# Patient Record
Sex: Female | Born: 1945 | Race: Black or African American | Hispanic: No | Marital: Single | Smoking: Current some day smoker
Health system: Southern US, Community
[De-identification: ages and names within clinical notes are randomized; demographics above are authoritative.]

## PROBLEM LIST (undated history)

## (undated) DIAGNOSIS — F419 Anxiety disorder, unspecified: Secondary | ICD-10-CM

## (undated) DIAGNOSIS — I10 Essential (primary) hypertension: Secondary | ICD-10-CM

## (undated) DIAGNOSIS — E559 Vitamin D deficiency, unspecified: Secondary | ICD-10-CM

## (undated) DIAGNOSIS — N189 Chronic kidney disease, unspecified: Secondary | ICD-10-CM

## (undated) DIAGNOSIS — E113413 Type 2 diabetes mellitus with severe nonproliferative diabetic retinopathy with macular edema, bilateral: Secondary | ICD-10-CM

## (undated) DIAGNOSIS — H269 Unspecified cataract: Secondary | ICD-10-CM

## (undated) DIAGNOSIS — D649 Anemia, unspecified: Secondary | ICD-10-CM

## (undated) DIAGNOSIS — H409 Unspecified glaucoma: Secondary | ICD-10-CM

## (undated) DIAGNOSIS — E785 Hyperlipidemia, unspecified: Secondary | ICD-10-CM

## (undated) DIAGNOSIS — E119 Type 2 diabetes mellitus without complications: Secondary | ICD-10-CM

## (undated) DIAGNOSIS — T7840XA Allergy, unspecified, initial encounter: Secondary | ICD-10-CM

## (undated) HISTORY — DX: Hyperlipidemia, unspecified: E78.5

## (undated) HISTORY — DX: Type 2 diabetes mellitus without complications: E11.9

## (undated) HISTORY — DX: Anemia, unspecified: D64.9

## (undated) HISTORY — PX: GLAUCOMA REPAIR: SHX214

## (undated) HISTORY — DX: Vitamin D deficiency, unspecified: E55.9

## (undated) HISTORY — DX: Type 2 diabetes mellitus with severe nonproliferative diabetic retinopathy with macular edema, bilateral: E11.3413

## (undated) HISTORY — PX: TOTAL VAGINAL HYSTERECTOMY: SHX2548

## (undated) HISTORY — DX: Anxiety disorder, unspecified: F41.9

## (undated) HISTORY — DX: Chronic kidney disease, unspecified: N18.9

## (undated) HISTORY — DX: Unspecified glaucoma: H40.9

## (undated) HISTORY — PX: TONSILLECTOMY AND ADENOIDECTOMY: SUR1326

## (undated) HISTORY — DX: Allergy, unspecified, initial encounter: T78.40XA

## (undated) HISTORY — DX: Unspecified cataract: H26.9

## (undated) HISTORY — PX: ANKLE FRACTURE SURGERY: SHX122

## (undated) HISTORY — DX: Essential (primary) hypertension: I10

---

## 2018-08-16 ENCOUNTER — Other Ambulatory Visit: Payer: Self-pay | Admitting: Internal Medicine

## 2018-08-16 DIAGNOSIS — Z1231 Encounter for screening mammogram for malignant neoplasm of breast: Secondary | ICD-10-CM

## 2018-08-16 DIAGNOSIS — R5381 Other malaise: Secondary | ICD-10-CM

## 2018-08-25 ENCOUNTER — Other Ambulatory Visit: Payer: Self-pay | Admitting: Internal Medicine

## 2018-08-25 DIAGNOSIS — R922 Inconclusive mammogram: Secondary | ICD-10-CM

## 2018-09-07 ENCOUNTER — Other Ambulatory Visit: Payer: Self-pay

## 2018-09-16 ENCOUNTER — Ambulatory Visit
Admission: RE | Admit: 2018-09-16 | Discharge: 2018-09-16 | Disposition: A | Discharge status: Home / Self Care | Payer: Medicare Other | Source: Ambulatory Visit | Attending: Internal Medicine | Admitting: Internal Medicine

## 2018-09-16 ENCOUNTER — Other Ambulatory Visit: Payer: Self-pay

## 2018-09-16 DIAGNOSIS — R922 Inconclusive mammogram: Secondary | ICD-10-CM

## 2020-03-25 IMAGING — MG DIGITAL DIAGNOSTIC BILATERAL MAMMOGRAM WITH TOMO AND CAD
8 series · 8 of 24 positions shown · non-contrast
Comparison: Previous exam(s).

CLINICAL DATA: Patient for short-term follow-up probably benign
bilateral breast masses on outside imaging.

EXAM:
DIGITAL DIAGNOSTIC BILATERAL MAMMOGRAM WITH CAD AND TOMO
ULTRASOUND BILATERAL BREAST

[R MLO synth-2D]
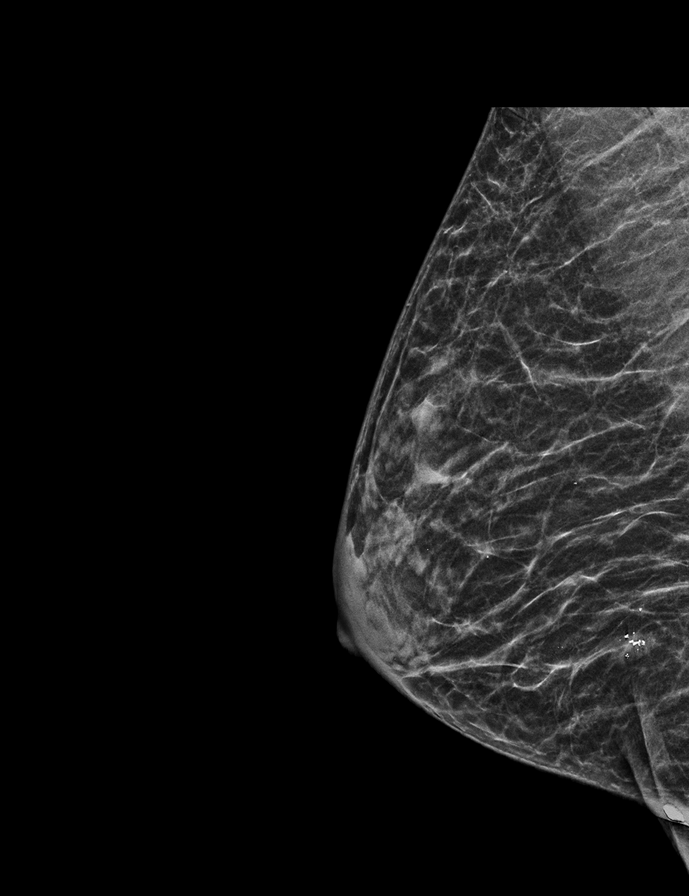

[R CC synth-2D]
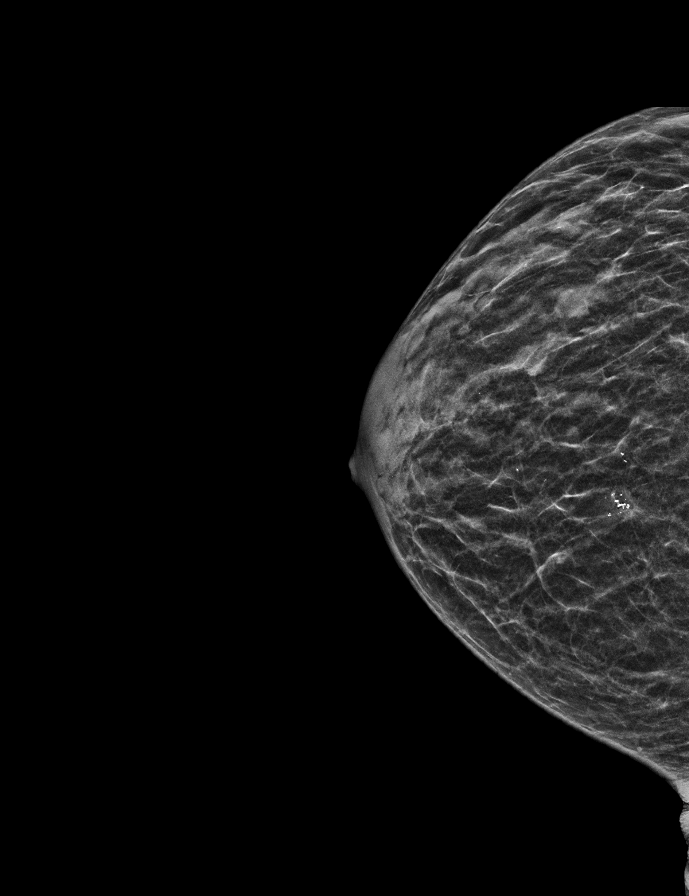

[L CC synth-2D]
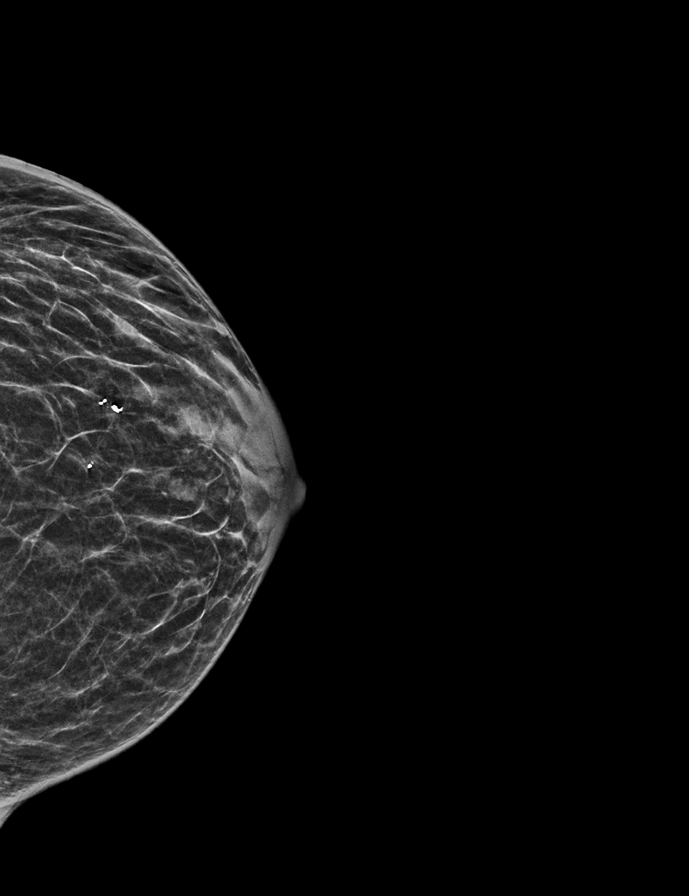

[L MLO synth-2D]
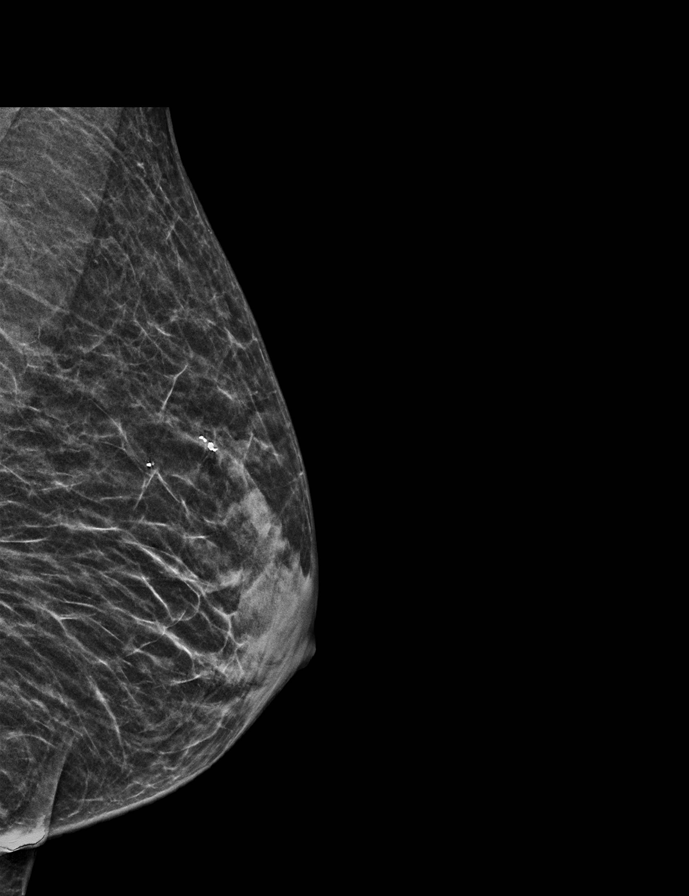

[L MLO tomo · tomo slice 19/36.0]
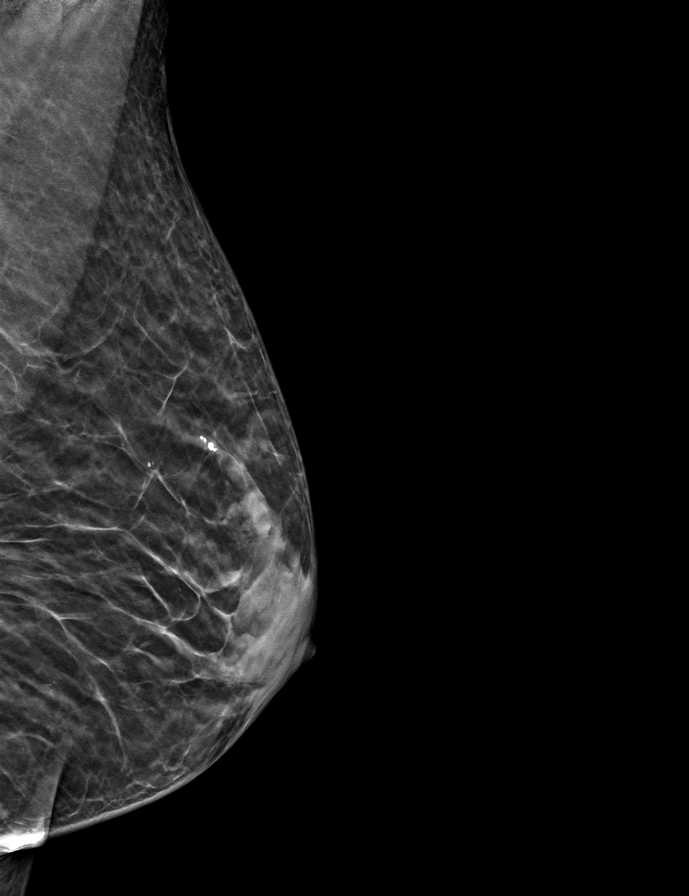

[R CC tomo · tomo slice 17/32.0]
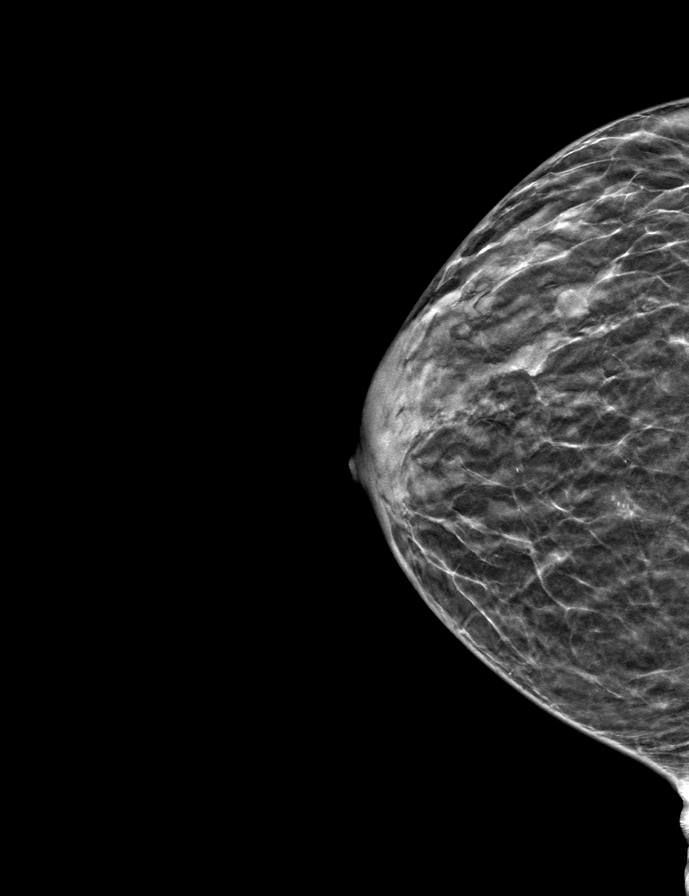

[R MLO tomo · tomo slice 18/35.0]
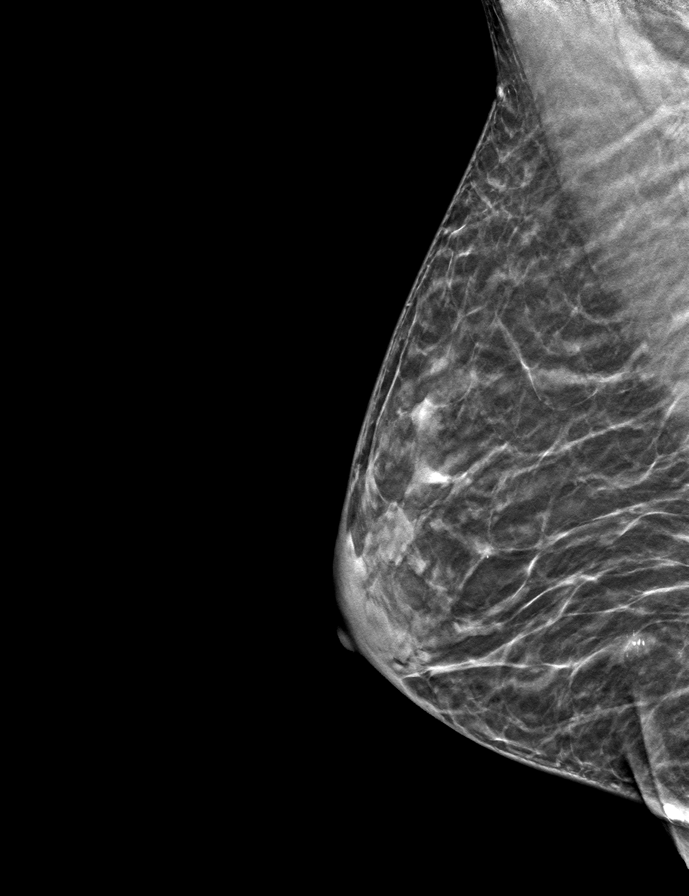

[L CC tomo · tomo slice 17/34.0]
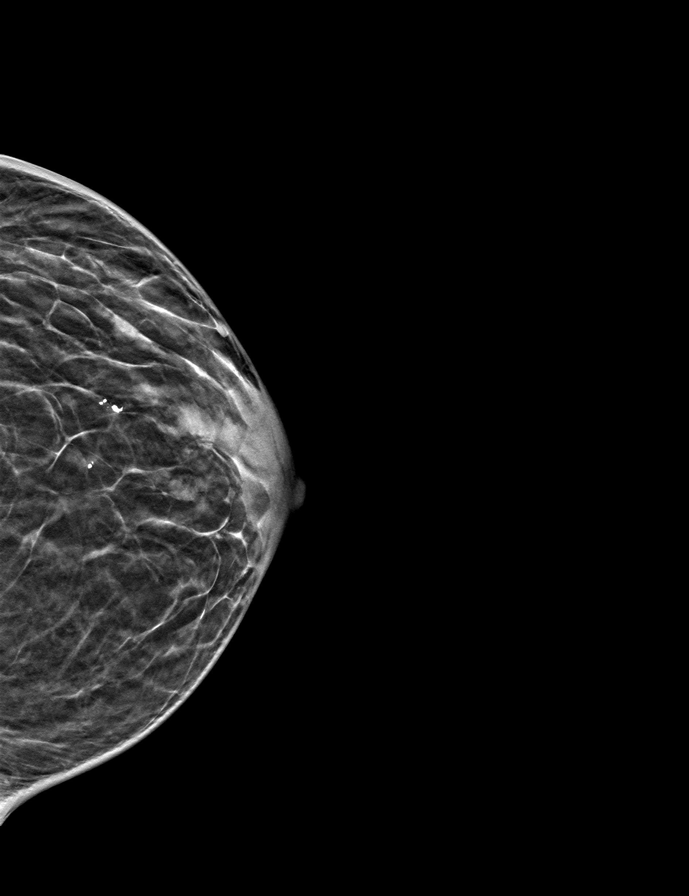

[8 of 24 positions shown; findings below may reference images not displayed]

ACR Breast Density Category c: The breast tissue is heterogeneously
dense, which may obscure small masses.
FINDINGS: Unchanged coarse calcifications with associated mass within the
lower inner right breast posterior depth. Oval circumscribed mass
within the outer right breast is stable. No suspicious findings
within the left breast.

Mammographic images were processed with CAD.

Targeted ultrasound is performed, showing an unchanged lobular
hypoechoic mass left breast 12 o'clock position 3 cm from nipple
measuring 7 x 7 x 3 mm.

Within the right breast 6 o'clock position 1 cm from nipple there is
an unchanged 10 x 4 x 9 mm oval circumscribed hypoechoic mass with
internal coarse calcifications.

Within the right breast 9 o'clock position 4 cm from nipple there is
an unchanged 4 x 2 x 5 mm oval circumscribed hypoechoic mass.

Within the right breast 9 o'clock position 6 cm from nipple there is
an unchanged 10 x 2 x 10 mm oval circumscribed hypoechoic mass.
IMPRESSION: Stable probably benign bilateral breast masses.

RECOMMENDATION:
Bilateral diagnostic mammography and ultrasound in 12 months to
demonstrate 2 years of stability of probably benign bilateral breast
masses.

I have discussed the findings and recommendations with the patient.
Results were also provided in writing at the conclusion of the
visit. If applicable, a reminder letter will be sent to the patient
regarding the next appointment.

BI-RADS CATEGORY  3: Probably benign.

## 2020-03-25 IMAGING — US ULTRASOUND RIGHT BREAST LIMITED
1 series · 13 of 14 positions shown · non-contrast
Comparison: Previous exam(s).

CLINICAL DATA: Patient for short-term follow-up probably benign
bilateral breast masses on outside imaging.

EXAM:
DIGITAL DIAGNOSTIC BILATERAL MAMMOGRAM WITH CAD AND TOMO
ULTRASOUND BILATERAL BREAST

[Series 1: ultrasound right breast limited · 0.06mm/px · 13 of 14 slices shown]
[im 1/14]
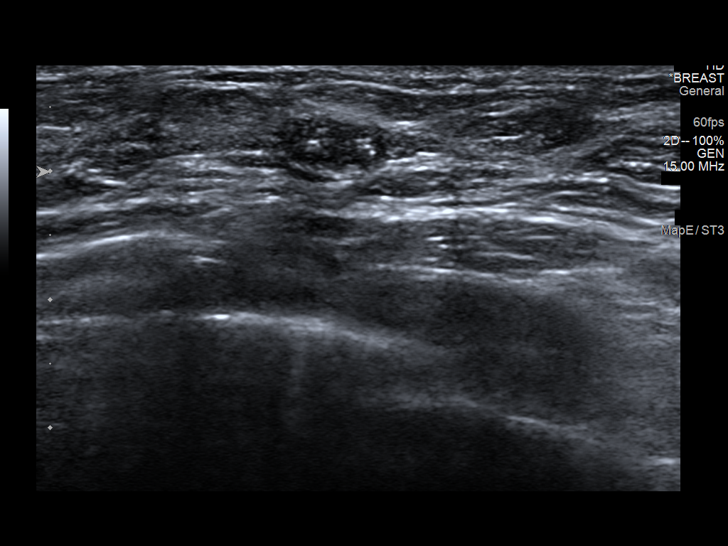
[im 2/14]
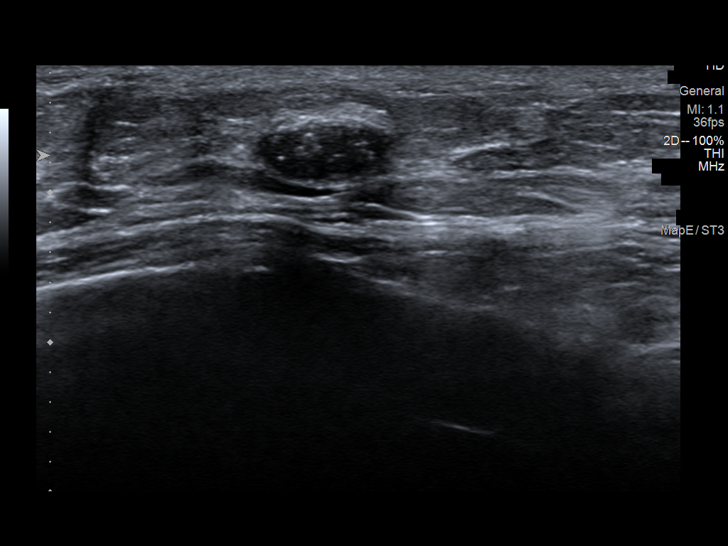
[im 3/14]
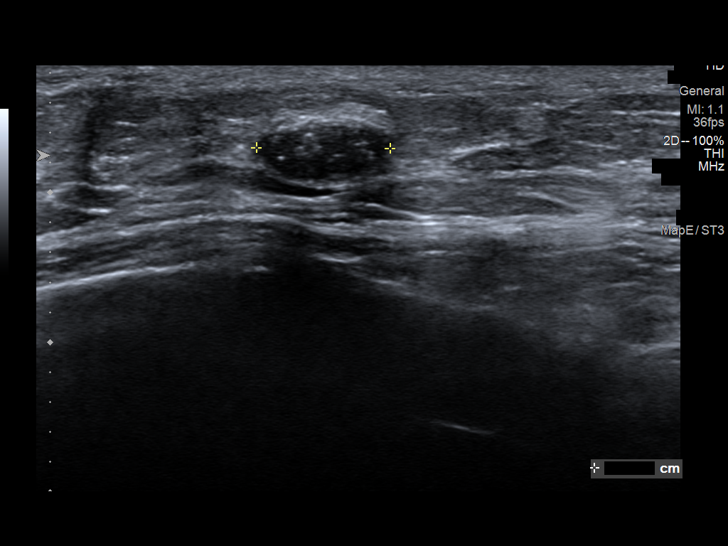
[im 4/14]
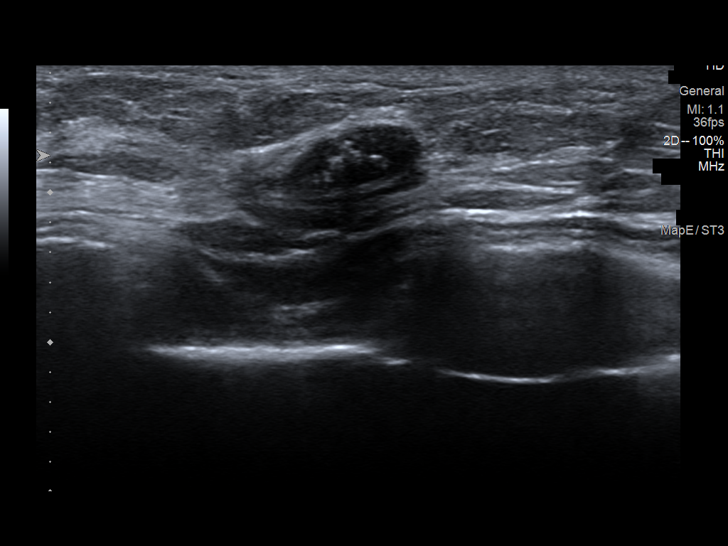
[im 5/14]
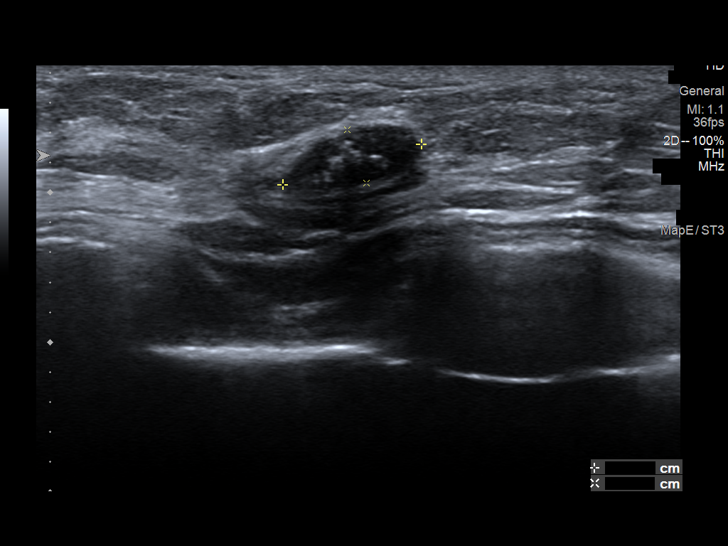
[im 6/14]
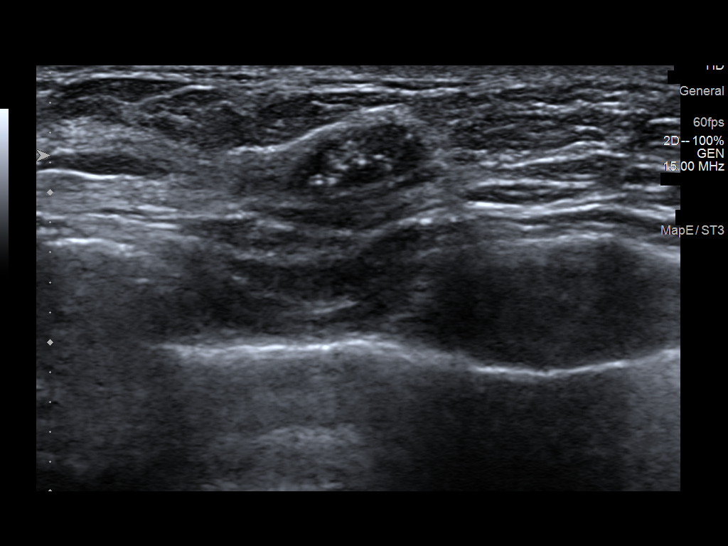
[im 8/14]
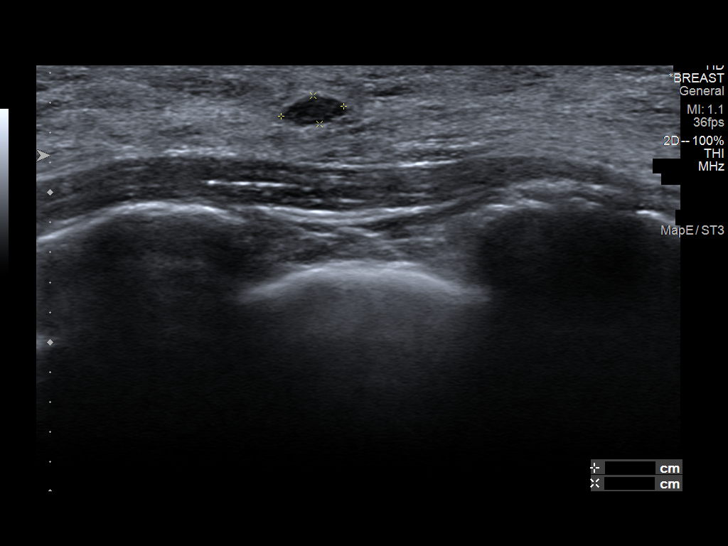
[im 9/14]
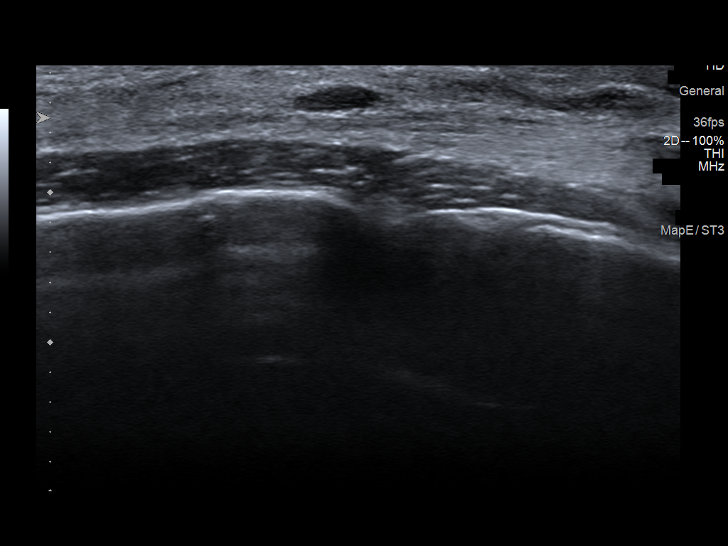
[im 10/14]
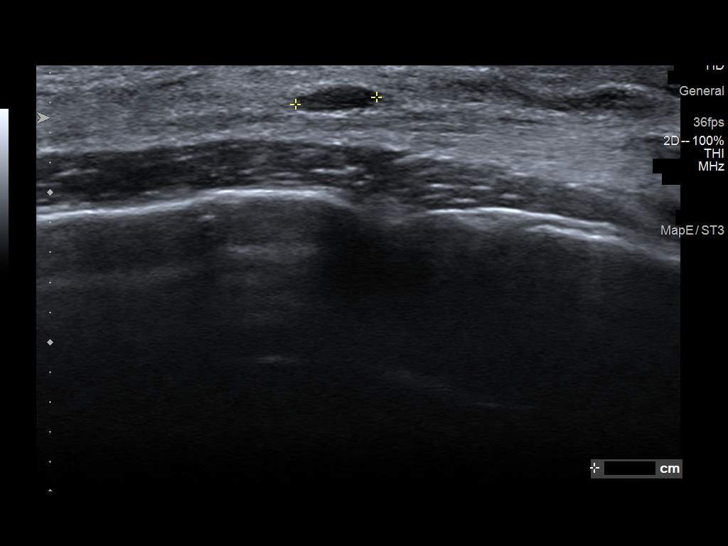
[im 11/14]
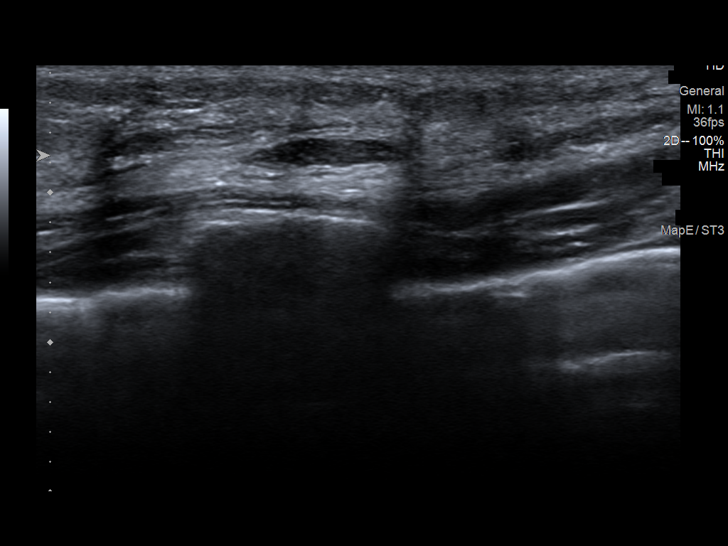
[im 12/14]
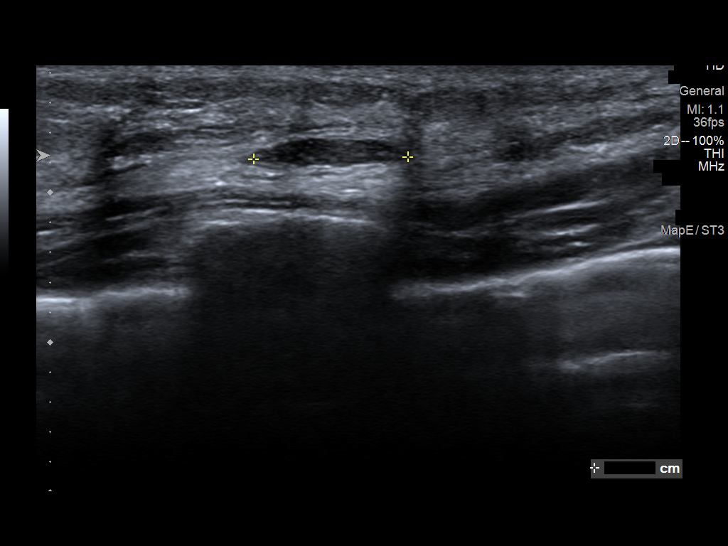
[im 13/14]
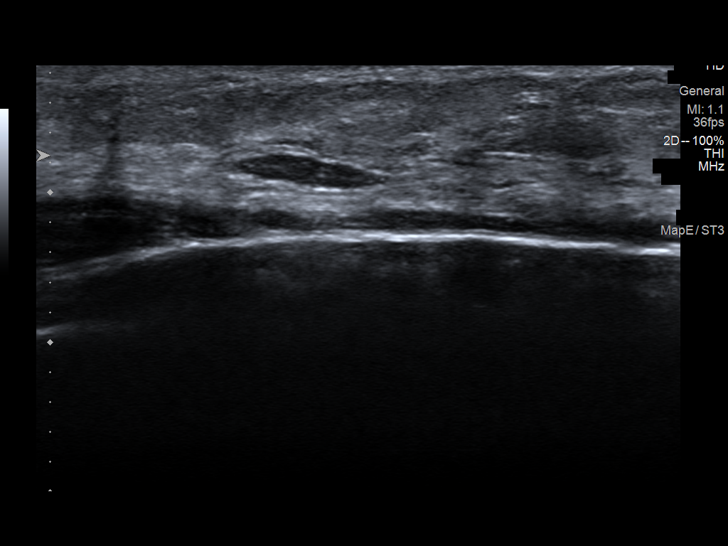
[im 14/14]
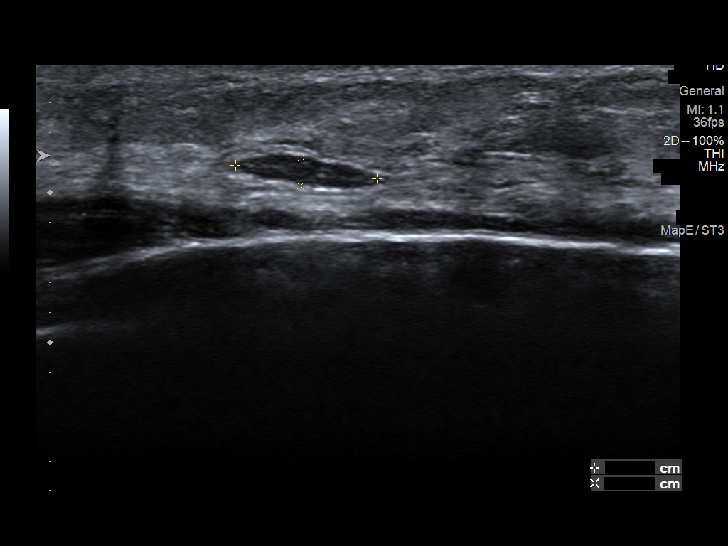

[13 of 14 positions shown; findings below may reference images not displayed]

ACR Breast Density Category c: The breast tissue is heterogeneously
dense, which may obscure small masses.
FINDINGS: Unchanged coarse calcifications with associated mass within the
lower inner right breast posterior depth. Oval circumscribed mass
within the outer right breast is stable. No suspicious findings
within the left breast.

Mammographic images were processed with CAD.

Targeted ultrasound is performed, showing an unchanged lobular
hypoechoic mass left breast 12 o'clock position 3 cm from nipple
measuring 7 x 7 x 3 mm.

Within the right breast 6 o'clock position 1 cm from nipple there is
an unchanged 10 x 4 x 9 mm oval circumscribed hypoechoic mass with
internal coarse calcifications.

Within the right breast 9 o'clock position 4 cm from nipple there is
an unchanged 4 x 2 x 5 mm oval circumscribed hypoechoic mass.

Within the right breast 9 o'clock position 6 cm from nipple there is
an unchanged 10 x 2 x 10 mm oval circumscribed hypoechoic mass.
IMPRESSION: Stable probably benign bilateral breast masses.

RECOMMENDATION:
Bilateral diagnostic mammography and ultrasound in 12 months to
demonstrate 2 years of stability of probably benign bilateral breast
masses.

I have discussed the findings and recommendations with the patient.
Results were also provided in writing at the conclusion of the
visit. If applicable, a reminder letter will be sent to the patient
regarding the next appointment.

BI-RADS CATEGORY  3: Probably benign.

## 2021-08-15 ENCOUNTER — Other Ambulatory Visit (HOSPITAL_COMMUNITY): Payer: Self-pay | Admitting: Internal Medicine

## 2021-08-15 DIAGNOSIS — Z992 Dependence on renal dialysis: Secondary | ICD-10-CM

## 2021-08-22 ENCOUNTER — Ambulatory Visit (HOSPITAL_COMMUNITY)
Admission: RE | Admit: 2021-08-22 | Discharge: 2021-08-22 | Disposition: A | Discharge status: Home / Self Care | Payer: Medicare (Managed Care) | Source: Ambulatory Visit | Attending: Internal Medicine | Admitting: Internal Medicine

## 2021-08-22 DIAGNOSIS — Z992 Dependence on renal dialysis: Secondary | ICD-10-CM | POA: Insufficient documentation

## 2021-08-22 NOTE — Progress Notes (Signed)
Upper extremity vein mapping has been completed.  ? ?Preliminary results in CV Proc.  ? ?Terianna Peggs Nichole Neyer ?08/22/2021 10:13 AM    ?

## 2023-07-08 ENCOUNTER — Encounter: Payer: Self-pay | Admitting: Gastroenterology

## 2023-08-02 ENCOUNTER — Emergency Department (HOSPITAL_COMMUNITY)
Admission: EM | Admit: 2023-08-02 | Discharge: 2023-08-03 | Disposition: A | Discharge status: Home / Self Care | Payer: Medicare (Managed Care) | Attending: Student | Admitting: Student

## 2023-08-02 DIAGNOSIS — N179 Acute kidney failure, unspecified: Secondary | ICD-10-CM | POA: Insufficient documentation

## 2023-08-02 DIAGNOSIS — R350 Frequency of micturition: Secondary | ICD-10-CM | POA: Insufficient documentation

## 2023-08-02 DIAGNOSIS — N189 Chronic kidney disease, unspecified: Secondary | ICD-10-CM | POA: Insufficient documentation

## 2023-08-02 DIAGNOSIS — I129 Hypertensive chronic kidney disease with stage 1 through stage 4 chronic kidney disease, or unspecified chronic kidney disease: Secondary | ICD-10-CM | POA: Diagnosis not present

## 2023-08-02 DIAGNOSIS — R197 Diarrhea, unspecified: Secondary | ICD-10-CM | POA: Diagnosis present

## 2023-08-02 DIAGNOSIS — E162 Hypoglycemia, unspecified: Secondary | ICD-10-CM | POA: Diagnosis not present

## 2023-08-02 DIAGNOSIS — U071 COVID-19: Secondary | ICD-10-CM | POA: Insufficient documentation

## 2023-08-02 LAB — CBG MONITORING, ED: Glucose-Capillary: 136 mg/dL — ABNORMAL HIGH (ref 70–99)

## 2023-08-02 LAB — RESP PANEL BY RT-PCR (RSV, FLU A&B, COVID)  RVPGX2
Influenza A by PCR: NEGATIVE
Influenza B by PCR: NEGATIVE
Resp Syncytial Virus by PCR: NEGATIVE
SARS Coronavirus 2 by RT PCR: POSITIVE — AB

## 2023-08-02 NOTE — ED Triage Notes (Addendum)
 Pt with low CBG this AM 40-60 , family called pt PCP who told her to come to ER, pt CBG for EMS 174 @ 2038.   Pt c/o cough x 1 week, not feeling like eating. Denies any other complaints, no cough per EMS when with pt.   Pt cbg on arrival 136

## 2023-08-03 LAB — COMPREHENSIVE METABOLIC PANEL
ALT: 14 U/L (ref 0–44)
AST: 26 U/L (ref 15–41)
Albumin: 3.4 g/dL — ABNORMAL LOW (ref 3.5–5.0)
Alkaline Phosphatase: 65 U/L (ref 38–126)
Anion gap: 9 (ref 5–15)
BUN: 59 mg/dL — ABNORMAL HIGH (ref 8–23)
CO2: 16 mmol/L — ABNORMAL LOW (ref 22–32)
Calcium: 8.2 mg/dL — ABNORMAL LOW (ref 8.9–10.3)
Chloride: 106 mmol/L (ref 98–111)
Creatinine, Ser: 3.35 mg/dL — ABNORMAL HIGH (ref 0.44–1.00)
GFR, Estimated: 14 mL/min — ABNORMAL LOW (ref >60)
Glucose, Bld: 78 mg/dL (ref 70–99)
Potassium: 4.8 mmol/L (ref 3.5–5.1)
Sodium: 131 mmol/L — ABNORMAL LOW (ref 135–145)
Total Bilirubin: 0.6 mg/dL (ref 0.0–1.2)
Total Protein: 7 g/dL (ref 6.5–8.1)

## 2023-08-03 LAB — URINALYSIS, ROUTINE W REFLEX MICROSCOPIC
Bilirubin Urine: NEGATIVE
Glucose, UA: NEGATIVE mg/dL
Hgb urine dipstick: NEGATIVE
Ketones, ur: NEGATIVE mg/dL
Nitrite: NEGATIVE
Protein, ur: 30 mg/dL — AB
Specific Gravity, Urine: 1.011 (ref 1.005–1.030)
pH: 5 (ref 5.0–8.0)

## 2023-08-03 LAB — CBC WITH DIFFERENTIAL/PLATELET
Abs Immature Granulocytes: 0.03 10*3/uL (ref 0.00–0.07)
Basophils Absolute: 0 10*3/uL (ref 0.0–0.1)
Basophils Relative: 0 %
Eosinophils Absolute: 0 10*3/uL (ref 0.0–0.5)
Eosinophils Relative: 0 %
HCT: 28 % — ABNORMAL LOW (ref 36.0–46.0)
Hemoglobin: 9.2 g/dL — ABNORMAL LOW (ref 12.0–15.0)
Immature Granulocytes: 1 %
Lymphocytes Relative: 16 %
Lymphs Abs: 0.9 10*3/uL (ref 0.7–4.0)
MCH: 31.5 pg (ref 26.0–34.0)
MCHC: 32.9 g/dL (ref 30.0–36.0)
MCV: 95.9 fL (ref 80.0–100.0)
Monocytes Absolute: 0.6 10*3/uL (ref 0.1–1.0)
Monocytes Relative: 11 %
Neutro Abs: 4.1 10*3/uL (ref 1.7–7.7)
Neutrophils Relative %: 72 %
Platelets: 322 10*3/uL (ref 150–400)
RBC: 2.92 MIL/uL — ABNORMAL LOW (ref 3.87–5.11)
RDW: 14.6 % (ref 11.5–15.5)
WBC: 5.7 10*3/uL (ref 4.0–10.5)
nRBC: 0 % (ref 0.0–0.2)

## 2023-08-03 LAB — CBG MONITORING, ED
Glucose-Capillary: 69 mg/dL — ABNORMAL LOW (ref 70–99)
Glucose-Capillary: 221 mg/dL — ABNORMAL HIGH (ref 70–99)

## 2023-08-03 LAB — I-STAT CG4 LACTIC ACID, ED: Lactic Acid, Venous: 0.8 mmol/L (ref 0.5–1.9)

## 2023-08-03 MED ORDER — ACETAMINOPHEN 500 MG PO TABS
1000.0000 mg | ORAL_TABLET | Freq: Once | ORAL | Status: AC
  Administered 2023-08-03: 1000 mg via ORAL
  Filled 2023-08-03: qty 2

## 2023-08-03 MED ORDER — ONDANSETRON 4 MG PO TBDP: 4.0000 mg | ORAL_TABLET | Freq: Three times a day (TID) | ORAL | 0 refills | Status: DC | PRN

## 2023-08-03 MED ORDER — LACTATED RINGERS IV BOLUS
1000.0000 mL | Freq: Once | INTRAVENOUS | Status: AC
  Administered 2023-08-03: 1000 mL via INTRAVENOUS

## 2023-08-03 MED ORDER — FOSFOMYCIN TROMETHAMINE 3 G PO PACK
3.0000 g | PACK | Freq: Once | ORAL | Status: AC
Start: 2023-08-03 — End: 2023-08-03
  Administered 2023-08-03: 3 g via ORAL
  Filled 2023-08-03: qty 3

## 2023-08-03 NOTE — ED Notes (Signed)
 Pt provided with a cup of oj and a mango ice for cbg 69

## 2023-08-03 NOTE — ED Provider Notes (Signed)
 [facility] EMERGENCY DEPARTMENT AT [facility] Provider Note  CSN: [id number] Arrival date & time: 08/02/23 2107  Chief Complaint(s) Hypoglycemia  HPI Karen Mckenzie is a 78 y.o. female with PMH CKD with last creatinine 3.0, anemia, HTN who presents emergency room for evaluation of hypoglycemia, urinary frequency and diarrhea.  States that she has had upper respiratory symptoms for the last 1 week with decreased p.o. intake and multiple episodes of diarrhea.  Patient's blood sugars reportedly between 40 and 60 this morning and spoke with primary care who informed her she needs to come to the emergency department for further evaluation.  Currently, patient is alert and oriented answering questions appropriately.  Currently denies chest pain, shortness of breath, abdominal pain, nausea, vomiting, headache, fever or other systemic symptoms.   Past Medical History No past medical history on file. There are no active problems to display for this patient.  Home Medication(s) Prior to Admission medications   Medication Sig Start Date End Date Taking? Authorizing Provider  ondansetron (ZOFRAN-ODT) 4 MG disintegrating tablet Take 1 tablet (4 mg total) by mouth every 8 (eight) hours as needed for nausea or vomiting. 08/03/23  Yes Evalisse Prajapati, MD                                                                                                                                    Past Surgical History No past surgical history on file. Family History Family History  Problem Relation Age of Onset   Breast cancer Mother 21   Breast cancer Sister 66    Social History   Allergies Ciprofloxacin, Citrus, Penicillins, Codeine, Erythromycin, Ibuprofen, Mushroom, Naproxen, Other, Sulfa antibiotics, Tetracyclines & related, Aspirin-acetaminophen-caffeine, Neomycin, Nsaids, Tuberculin purified protein derivative, Aspirin, Azithromycin, and Strawberry flavoring agent (non-screening)  Review of  Systems Review of Systems  Gastrointestinal:  Positive for diarrhea.  Genitourinary:  Positive for frequency.    Physical Exam Vital Signs  I have reviewed the triage vital signs BP (!) 103/53   Pulse 82   Temp 99.1 F (37.3 C) (Oral)   Resp 18   Ht 4\' 11"  (1.499 m)   Wt 41.3 kg   SpO2 97%   BMI 18.38 kg/m   Physical Exam Vitals and nursing note reviewed.  Constitutional:      General: She is not in acute distress.    Appearance: She is well-developed.  HENT:     Head: Normocephalic and atraumatic.  Eyes:     Conjunctiva/sclera: Conjunctivae normal.  Cardiovascular:     Rate and Rhythm: Normal rate and regular rhythm.     Heart sounds: No murmur heard. Pulmonary:     Effort: Pulmonary effort is normal. No respiratory distress.     Breath sounds: Normal breath sounds.  Abdominal:     Palpations: Abdomen is soft.     Tenderness: There is no abdominal tenderness.  Musculoskeletal:  General: No swelling.     Cervical back: Neck supple.  Skin:    General: Skin is warm and dry.     Capillary Refill: Capillary refill takes less than 2 seconds.  Neurological:     Mental Status: She is alert.  Psychiatric:        Mood and Affect: Mood normal.     ED Results and Treatments Labs (all labs ordered are listed, but only abnormal results are displayed) Labs Reviewed  RESP PANEL BY RT-PCR (RSV, FLU A&B, COVID)  RVPGX2 - Abnormal; Notable for the following components:      Result Value   SARS Coronavirus 2 by RT PCR POSITIVE (*)    All other components within normal limits  COMPREHENSIVE METABOLIC PANEL - Abnormal; Notable for the following components:   Sodium 131 (*)    CO2 16 (*)    BUN 59 (*)    Creatinine, Ser 3.35 (*)    Calcium 8.2 (*)    Albumin 3.4 (*)    GFR, Estimated 14 (*)    All other components within normal limits  CBC WITH DIFFERENTIAL/PLATELET - Abnormal; Notable for the following components:   RBC 2.92 (*)    Hemoglobin 9.2 (*)    HCT  28.0 (*)    All other components within normal limits  URINALYSIS, ROUTINE W REFLEX MICROSCOPIC - Abnormal; Notable for the following components:   Protein, ur 30 (*)    Leukocytes,Ua MODERATE (*)    Bacteria, UA RARE (*)    All other components within normal limits  CBG MONITORING, ED - Abnormal; Notable for the following components:   Glucose-Capillary 136 (*)    All other components within normal limits  CBG MONITORING, ED - Abnormal; Notable for the following components:   Glucose-Capillary 69 (*)    All other components within normal limits  CBG MONITORING, ED - Abnormal; Notable for the following components:   Glucose-Capillary 221 (*)    All other components within normal limits  URINE CULTURE  I-STAT CG4 LACTIC ACID, ED  I-STAT CG4 LACTIC ACID, ED                                                                                                                          Radiology No results found.  Pertinent labs & imaging results that were available during my care of the patient were reviewed by me and considered in my medical decision making (see MDM for details).  Medications Ordered in ED Medications  acetaminophen (TYLENOL) tablet 1,000 mg (1,000 mg Oral Given 08/03/23 0214)  fosfomycin (MONUROL) packet 3 g (3 g Oral Given 08/03/23 0415)  lactated ringers bolus 1,000 mL (1,000 mLs Intravenous Bolus 08/03/23 0414)  Procedures Procedures  (including critical care time)  Medical Decision Making / ED Course   This patient presents to the ED for concern of hypoglycemia, diarrhea, urinary frequency, cough, this involves an extensive number of treatment options, and is a complaint that carries with it a high risk of complications and morbidity.  The differential diagnosis includes COVID-19, influenza, RSV, unspecified viral URI, norovirus, decreased  p.o. intake, insulinoma  MDM: Patient seen emergency room for evaluation of hypoglycemia, diarrhea, cough and urinary frequency.  Physical exam largely unremarkable.  Laboratory evaluation with a hemoglobin of 9.2, creatinine 3.35 with a BUN of 59, CO2 16 with a normal anion gap.  Lactic acid normal.  Urinalysis with moderate leuk esterase, 6-10 white blood cells and rare bacteria.  Patient is COVID-positive which likely is the source of her initial diarrhea and decreased p.o. intake.  She did have an initial drop in her sugar to 69 but after oral repletion a quickly bounced back to 221.  Patient will be covered with fosfomycin and the urine culture sent.  She was fluid resuscitated and able to tolerate p.o. without difficulty.  Her AKI is mild as her creatinine baseline appears to be 3.0.  At this time as she is not able to tolerate p.o. she does not meet inpatient criteria for admission and will be discharged with outpatient follow-up.  Will give Zofran for home use to help encourage p.o. intake at home.  Return precautions given of which she and her daughter voiced understanding.  Patient discharged   Additional history obtained: -Additional history obtained from daughter -External records from outside source obtained and reviewed including: Chart review including previous notes, labs, imaging, consultation notes   Lab Tests: -I ordered, reviewed, and interpreted labs.   The pertinent results include:   Labs Reviewed  RESP PANEL BY RT-PCR (RSV, FLU A&B, COVID)  RVPGX2 - Abnormal; Notable for the following components:      Result Value   SARS Coronavirus 2 by RT PCR POSITIVE (*)    All other components within normal limits  COMPREHENSIVE METABOLIC PANEL - Abnormal; Notable for the following components:   Sodium 131 (*)    CO2 16 (*)    BUN 59 (*)    Creatinine, Ser 3.35 (*)    Calcium 8.2 (*)    Albumin 3.4 (*)    GFR, Estimated 14 (*)    All other components within normal limits  CBC  WITH DIFFERENTIAL/PLATELET - Abnormal; Notable for the following components:   RBC 2.92 (*)    Hemoglobin 9.2 (*)    HCT 28.0 (*)    All other components within normal limits  URINALYSIS, ROUTINE W REFLEX MICROSCOPIC - Abnormal; Notable for the following components:   Protein, ur 30 (*)    Leukocytes,Ua MODERATE (*)    Bacteria, UA RARE (*)    All other components within normal limits  CBG MONITORING, ED - Abnormal; Notable for the following components:   Glucose-Capillary 136 (*)    All other components within normal limits  CBG MONITORING, ED - Abnormal; Notable for the following components:   Glucose-Capillary 69 (*)    All other components within normal limits  CBG MONITORING, ED - Abnormal; Notable for the following components:   Glucose-Capillary 221 (*)    All other components within normal limits  URINE CULTURE  I-STAT CG4 LACTIC ACID, ED  I-STAT CG4 LACTIC ACID, ED       Medicines ordered and prescription drug management: Meds ordered this  encounter  Medications   acetaminophen (TYLENOL) tablet 1,000 mg   fosfomycin (MONUROL) packet 3 g   lactated ringers bolus 1,000 mL   ondansetron (ZOFRAN-ODT) 4 MG disintegrating tablet    Sig: Take 1 tablet (4 mg total) by mouth every 8 (eight) hours as needed for nausea or vomiting.    Dispense:  20 tablet    Refill:  0    -I have reviewed the patients home medicines and have made adjustments as needed  Critical interventions none    Social Determinants of Health:  Factors impacting patients care include: none   Reevaluation: After the interventions noted above, I reevaluated the patient and found that they have :improved  Co morbidities that complicate the patient evaluation No past medical history on file.    Dispostion: I considered admission for this patient, but at this time she does not meet inpatient criteria for admission and will be discharged with outpatient follow-up.     Final Clinical  Impression(s) / ED Diagnoses Final diagnoses:  Hypoglycemia  COVID  AKI (acute kidney injury) [facility])     @PCDICTATION @    Glendora Score, MD 08/03/23 [phone]

## 2023-08-04 LAB — URINE CULTURE

## 2023-08-20 ENCOUNTER — Ambulatory Visit: Payer: Medicare (Managed Care) | Admitting: Gastroenterology

## 2023-10-04 ENCOUNTER — Telehealth: Payer: Self-pay | Admitting: Podiatry

## 2023-10-04 NOTE — Telephone Encounter (Signed)
 Due to not having access to your previous appointments, Patient advised she had a 10:00 appointment today 10/04/23 and will not make it to appointment. She requested to cancel appointment

## 2023-10-05 ENCOUNTER — Other Ambulatory Visit: Payer: Self-pay

## 2023-10-08 ENCOUNTER — Other Ambulatory Visit (INDEPENDENT_AMBULATORY_CARE_PROVIDER_SITE_OTHER): Payer: Medicare (Managed Care)

## 2023-10-08 ENCOUNTER — Encounter: Payer: Self-pay | Admitting: Gastroenterology

## 2023-10-08 ENCOUNTER — Ambulatory Visit: Payer: Medicare (Managed Care) | Admitting: Gastroenterology

## 2023-10-08 VITALS — BP 124/60 | HR 72 | Ht <= 58 in | Wt 89.0 lb

## 2023-10-08 DIAGNOSIS — E1122 Type 2 diabetes mellitus with diabetic chronic kidney disease: Secondary | ICD-10-CM

## 2023-10-08 DIAGNOSIS — R634 Abnormal weight loss: Secondary | ICD-10-CM

## 2023-10-08 DIAGNOSIS — D509 Iron deficiency anemia, unspecified: Secondary | ICD-10-CM | POA: Diagnosis not present

## 2023-10-08 DIAGNOSIS — Z1211 Encounter for screening for malignant neoplasm of colon: Secondary | ICD-10-CM

## 2023-10-08 LAB — COMPREHENSIVE METABOLIC PANEL WITH GFR
ALT: 7 U/L (ref 0–35)
AST: 12 U/L (ref 0–37)
Albumin: 4.2 g/dL (ref 3.5–5.2)
Alkaline Phosphatase: 89 U/L (ref 39–117)
BUN: 41 mg/dL — ABNORMAL HIGH (ref 6–23)
CO2: 20 meq/L (ref 19–32)
Calcium: 9.4 mg/dL (ref 8.4–10.5)
Chloride: 109 meq/L (ref 96–112)
Creatinine, Ser: 2.77 mg/dL — ABNORMAL HIGH (ref 0.40–1.20)
GFR: 16.23 mL/min — ABNORMAL LOW (ref >60.00)
Glucose, Bld: 136 mg/dL — ABNORMAL HIGH (ref 70–99)
Potassium: 5 meq/L (ref 3.5–5.1)
Sodium: 138 meq/L (ref 135–145)
Total Bilirubin: 0.4 mg/dL (ref 0.2–1.2)
Total Protein: 7.3 g/dL (ref 6.0–8.3)

## 2023-10-08 LAB — IBC + FERRITIN
Ferritin: 16 ng/mL (ref 10.0–291.0)
Iron: 55 ug/dL (ref 42–145)
Saturation Ratios: 14.5 % — ABNORMAL LOW (ref 20.0–50.0)
TIBC: 379.4 ug/dL (ref 250.0–450.0)
Transferrin: 271 mg/dL (ref 212.0–360.0)

## 2023-10-08 LAB — CBC WITH DIFFERENTIAL/PLATELET
Basophils Absolute: 0 10*3/uL (ref 0.0–0.1)
Basophils Relative: 0.6 % (ref 0.0–3.0)
Eosinophils Absolute: 0.2 10*3/uL (ref 0.0–0.7)
Eosinophils Relative: 2.2 % (ref 0.0–5.0)
HCT: 28.2 % — ABNORMAL LOW (ref 36.0–46.0)
Hemoglobin: 9.2 g/dL — ABNORMAL LOW (ref 12.0–15.0)
Lymphocytes Relative: 25.1 % (ref 12.0–46.0)
Lymphs Abs: 1.8 10*3/uL (ref 0.7–4.0)
MCHC: 32.7 g/dL (ref 30.0–36.0)
MCV: 100 fl (ref 78.0–100.0)
Monocytes Absolute: 0.5 10*3/uL (ref 0.1–1.0)
Monocytes Relative: 7 % (ref 3.0–12.0)
Neutro Abs: 4.8 10*3/uL (ref 1.4–7.7)
Neutrophils Relative %: 65.1 % (ref 43.0–77.0)
Platelets: 414 10*3/uL — ABNORMAL HIGH (ref 150.0–400.0)
RBC: 2.82 Mil/uL — ABNORMAL LOW (ref 3.87–5.11)
RDW: 14.6 % (ref 11.5–15.5)
WBC: 7.3 10*3/uL (ref 4.0–10.5)

## 2023-10-08 LAB — VITAMIN B12: Vitamin B-12: 1305 pg/mL — ABNORMAL HIGH (ref 211–911)

## 2023-10-08 LAB — FOLATE: Folate: 23.3 ng/mL (ref >5.9)

## 2023-10-08 MED ORDER — NA SULFATE-K SULFATE-MG SULF 17.5-3.13-1.6 GM/177ML PO SOLN: ORAL | 0 refills | Status: DC

## 2023-10-08 NOTE — Progress Notes (Signed)
 Chief Complaint: Evaluation of anemia Primary GI MD: Para Bold  HPI: 78 year old female history of diabetes, hypertension, hyperlipidemia, anemia, presents for evaluation of iron deficiency anemia  Referred by PCP for iron deficiency anemia  Appears patient had mild anemia noted October 2020 with Hgb 11.7 and this ranged from 10-11 up until July 2024 when she was noted to have Hgb 7.5.  12/11/2022: Hgb 7.5, MCV 99.8 02/03/2023: Hgb 9.1, MCV 94.5. Iron 47, saturation 14%, TIBC 341 06/22/2023: Hgb 10.8, MCV 96, platelets 414.  Iron 45, saturation 12%, TIBC 362 08/03/2023: Hgb 9.2, MCV 95.9 08/23/2023: Hgb 7.6, MCV 97.4 09/14/2023: Hgb 9.5, MCV 97.8   Discussed the use of AI scribe software for clinical note transcription with the patient, who gave verbal consent to proceed.  History of Present Illness she has a history of anemia since childhood, with a noted worsening since 2020. Her hemoglobin levels have decreased from 10-11 in 2020 to the 7s currently. She is currently taking iron supplements once daily and has not required blood transfusions or iron infusions. No gastrointestinal bleeding, rectal bleeding, or black stools.  She underwent a colonoscopy approximately ten years ago at the [place] at the [place] in [place]  but has never had an endoscopy.  Reportedly normal with no polyps.  No changes in bowel habits, nausea, or vomiting. She has experienced significant weight loss from 187 pounds to 89 pounds over the past 15 years, initially intentional due to a prediabetes diagnosis in 2005, which led her to reduce her portion sizes.  Her past medical history includes a diagnosis of borderline kidney disease, though her kidneys are still functioning well without any urinary symptoms. No family history of colon cancer, though a cousin had colon polyps.  She takes B12 supplements in pill form and recalls receiving B12 shots weekly as a child. No history of heart attack, stroke, or  seizures.   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 Take one tablet by mouth every 7 days     amLODipine (NORVASC) 10 MG tablet Take 10 mg by mouth daily.     ascorbic acid (VITAMIN C) 250 MG tablet Take 1 tablet by mouth daily.     ASPIRIN 81 PO Take 1 tablet by mouth daily.     atorvastatin (LIPITOR) 80 MG tablet Take 80 mg by mouth daily.     brimonidine (ALPHAGAN) 0.2 % ophthalmic solution Place 1 drop into the left eye 3 (three) times daily.     Brinzolamide-Brimonidine 1-0.2 % SUSP Place 0.05 mLs into both eyes 2 (two) times daily.     Calcium Carbonate Antacid (CALCIUM CARBONATE PO) Take 1,200 mg by mouth daily.     cyanocobalamin (VITAMIN B12) 250 MCG tablet Take 500 mcg by mouth  daily.     dorzolamide-timolol (COSOPT) 2-0.5 % ophthalmic solution Place 1 drop into both eyes 2 (two) times daily.     ferrous sulfate 325 (65 FE) MG tablet Take 325 mg by mouth 2 (two) times daily.     FREESTYLE TEST STRIPS test strip 3 (three) times daily.     glipiZIDE (GLUCOTROL) 5 MG tablet Take 1 tablet by mouth 2 (two) times daily.     insulin glargine (LANTUS SOLOSTAR) 100 UNIT/ML Solostar Pen Inject 10-15 Units into the skin.     JANUVIA 25 MG tablet Take by mouth.     latanoprost (XALATAN) 0.005 % ophthalmic solution Place 1 drop into the left eye at  bedtime.     lisinopril (ZESTRIL) 10 MG tablet Take 1 tablet by mouth every morning.     Netarsudil-Latanoprost (ROCKLATAN) 0.02-0.005 % SOLN Apply 1 drop to eye daily.     Vitamin D, Ergocalciferol, (DRISDOL) 1.25 MG (50000 UNIT) CAPS capsule Take 1 capsule by mouth once a week.     VITAMIN E PO Take 1 capsule by mouth daily.     No current facility-administered medications for this visit.    Allergies as of 10/08/2023 - never reviewed  Allergen Reaction Noted   Ciprofloxacin Anaphylaxis and Hives 09/17/2015   Citrus Itching 07/11/2020   Penicillins Anaphylaxis, Hives, and Other (See Comments) 09/17/2015   Codeine Other (See Comments) 03/17/2012   Erythromycin Hives and Rash 03/17/2012   Ibuprofen Nausea And Vomiting and Other (See Comments) 07/30/2016   Mushroom  05/26/2018   Naproxen Other (See Comments) 07/30/2016   Other Hives, Diarrhea, Nausea And Vomiting, Swelling, and Rash 03/17/2012   Sulfa antibiotics Swelling, Diarrhea, Hives, and Rash 03/17/2012   Tetracyclines & related Rash and Swelling 07/30/2016   Aspirin-acetaminophen -caffeine Swelling 01/12/2014   Neomycin Hives 10/30/2015   Nsaids Diarrhea 01/12/2014   Tuberculin purified protein derivative  07/30/2016   Aspirin Diarrhea 05/30/2018   Azithromycin Hives and Rash 03/17/2012   Strawberry flavoring agent (non-screening) Rash 07/16/2022    Family History  Problem Relation Age of Onset   Breast cancer Mother 12   Hypertension Father    Lung cancer Father    Breast cancer Sister 44   Diabetes Maternal Grandmother    Heart attack Maternal Aunt     Social History   Socioeconomic History   Marital status: Single    Spouse name: Not on file   Number of children: 0   Years of education: Not on file   Highest education level: Not on file  Occupational History   Occupation: retired  Tobacco Use   Smoking status: Every Day    Types: Cigarettes   Smokeless tobacco: Never  Vaping Use   Vaping status: Never  Used  Substance and Sexual Activity   Alcohol use: Never   Drug use: Never   Sexual activity: Not on file  Other Topics Concern   Not on file  Social History Narrative   Not on file   Social Drivers of Health   Financial Resource Strain: Low Risk  (06/22/2023)   Received from [facility]   Overall Financial Resource Strain (CARDIA)    Difficulty of Paying Living Expenses: Not hard at all  Food Insecurity: No Food Insecurity (06/22/2023)   Received from [facility]   Hunger Vital Sign    Worried About Running Out of Food in the Last Year: Never true    Ran Out of Food in the Last Year: Never true  Transportation  Needs: No Transportation Needs (06/22/2023)   Received from [facility]   PRAPARE - Transportation    Lack of Transportation (Medical): No    Lack of Transportation (Non-Medical): No  Physical Activity: Inactive (06/22/2023)   Received from [facility]   Exercise Vital Sign    Days of Exercise per Week: 0 days    Minutes of Exercise per Session: 20 min  Stress: No Stress Concern Present (06/22/2023)   Received from [facility] - Occupational Stress Questionnaire    Feeling of Stress : Not at all  Social Connections: Socially Integrated (06/22/2023)   Received from [facility]   Social Network    How would you rate your social network (family, work, friends)?: Good participation with social networks  Intimate Partner Violence: Not At Risk (06/22/2023)   Received from [facility]   HITS    Over the last 12 months how often did your partner physically hurt you?: Never    Over the last 12 months how often did your partner insult you or talk down to you?: Never    Over the last 12 months how often did your partner threaten you with physical harm?: Never    Over the last 12 months how often did your partner scream or curse at you?: Never    Review of Systems:    Constitutional: No weight loss, fever, chills, weakness  or fatigue HEENT: Eyes: No change in vision               Ears, Nose, Throat:  No change in hearing or congestion Skin: No rash or itching Cardiovascular: No chest pain, chest pressure or palpitations   Respiratory: No SOB or cough Gastrointestinal: See HPI and otherwise negative Genitourinary: No dysuria or change in urinary frequency Neurological: No headache, dizziness or syncope Musculoskeletal: No new muscle or joint pain Hematologic: No bleeding or bruising Psychiatric: No history of depression or anxiety    Physical Exam:  Vital signs: BP 124/60 (BP Location: Left Arm, Patient Position: Sitting, Cuff Size: Normal)   Pulse 72   Ht 4' 8.5" (1.435 m) Comment: height measured without shoes  Wt 89 lb (40.4 kg)   BMI 19.60 kg/m   Constitutional: NAD, alert and cooperative.  Thin and frail female appears younger than stated age Head:  Normocephalic and atraumatic. Eyes:   PEERL, EOMI. No icterus. Conjunctiva pink. Respiratory: Respirations even and unlabored. Lungs clear to auscultation bilaterally.   No wheezes, crackles, or rhonchi.  Cardiovascular:  Regular rate and rhythm. No peripheral edema, cyanosis or pallor.  Gastrointestinal:  Soft, nondistended, nontender. No rebound or guarding. Normal bowel sounds. No appreciable masses or hepatomegaly. Rectal:  Declines Msk:  Symmetrical without gross deformities. Without edema, no deformity or joint abnormality.  Neurologic:  Alert and  oriented x4;  grossly normal neurologically.  Skin:   Dry and intact without significant lesions or rashes. Psychiatric: Oriented to person, place and time. Demonstrates good judgement and reason without abnormal affect or behaviors.   RELEVANT LABS AND IMAGING: CBC    Component Value Date/Time   WBC 5.7 08/03/2023 0234   RBC 2.92 (L) 08/03/2023 0234   HGB 9.2 (L) 08/03/2023 0234   HCT 28.0 (L) 08/03/2023 0234   PLT 322 08/03/2023 0234   MCV 95.9 08/03/2023 0234   MCH 31.5 08/03/2023 0234    MCHC 32.9 08/03/2023 0234   RDW 14.6 08/03/2023 0234   LYMPHSABS 0.9 08/03/2023 0234   MONOABS  0.6 08/03/2023 0234   EOSABS 0.0 08/03/2023 0234   BASOSABS 0.0 08/03/2023 0234    CMP     Component Value Date/Time   NA 131 (L) 08/03/2023 0234   K 4.8 08/03/2023 0234   CL 106 08/03/2023 0234   CO2 16 (L) 08/03/2023 0234   GLUCOSE 78 08/03/2023 0234   BUN 59 (H) 08/03/2023 0234   CREATININE 3.35 (H) 08/03/2023 0234   CALCIUM 8.2 (L) 08/03/2023 0234   PROT 7.0 08/03/2023 0234   ALBUMIN 3.4 (L) 08/03/2023 0234   AST 26 08/03/2023 0234   ALT 14 08/03/2023 0234   ALKPHOS 65 08/03/2023 0234   BILITOT 0.6 08/03/2023 0234   GFRNONAA 14 (L) 08/03/2023 0234     Assessment/Plan:   Anemia, likely multifactorial Normocytic anemia reportedly since she was 7 with documentation since at least 2020 and worsening drop in hemoglobin since July 2024 ranging 7-9.  Mild iron deficiency with iron 47, saturation 14%, TIBC 341.  No ferritin.  Colonoscopy over 10 years ago no previous EGD.   History of CKD likely contributing to patient's anemia and suspect vitamin B12 deficiency since she reports B12 shots ongoing since childhood and now taking daily tablet.  On iron once daily with no previous blood transfusions or iron infusions.  No GI symptoms and no overt bleeding. - CBC, CMP, iron/ferritin, vitamin B12/folate - EGD/colonoscopy for further evaluation - I thoroughly discussed the procedure with the patient (at bedside) to include nature of the procedure, alternatives, benefits, and risks (including but not limited to bleeding, infection, perforation, anesthesia/cardiac pulmonary complications).  Patient verbalized understanding and gave verbal consent to proceed with procedure. - Continue iron supplement once daily  CKD BUN 59, creatinine 3.35, GFR 14.  Likely contributing to her anemia.  Diabetes  Weight loss Weight loss going from 187 pounds to 89 pounds over the past 15 to 20 years after  changing her portion size.  Her weight has remained stable though on physical exam she does appear thin but younger than stated age. - Continue to monitor  Assigned to Dr. Cherryl Corona based on procedure schedule availability  [facility] 10/08/2023, 10:19 AM  Cc: Alfredia Ina, MD

## 2023-10-08 NOTE — Patient Instructions (Addendum)
 You have been scheduled for an endoscopy and colonoscopy. Please follow the written instructions given to you at your visit today.  If you use inhalers (even only as needed), please bring them with you on the day of your procedure.  DO NOT TAKE 7 DAYS PRIOR TO TEST- Trulicity (dulaglutide) Ozempic, Wegovy (semaglutide) Mounjaro (tirzepatide) Bydureon Bcise (exanatide extended release)  DO NOT TAKE 1 DAY PRIOR TO YOUR TEST Rybelsus (semaglutide) Adlyxin (lixisenatide) Victoza (liraglutide) Byetta (exanatide) ___________________________________________________________________________  We have sent the following medications to your pharmacy for you to pick up at your convenience: Suprep  If your blood pressure at your visit was 140/90 or greater, please contact your primary care physician to follow up on this.  _______________________________________________________  If you are age 76 or older, your body mass index should be between 23-30. Your Body mass index is 19.6 kg/m. If this is out of the aforementioned range listed, please consider follow up with your Primary Care Provider.  If you are age 49 or younger, your body mass index should be between 19-25. Your Body mass index is 19.6 kg/m. If this is out of the aformentioned range listed, please consider follow up with your Primary Care Provider.   ________________________________________________________  The [facility] providers would like to encourage you to use MYCHART to communicate with providers for non-urgent requests or questions.  Due to long hold times on the telephone, sending your provider a message by [facility] may be a faster and more efficient way to get a response.  Please allow 48 business hours for a response.  Please remember that this is for non-urgent requests.  _______________________________________________________ Due to recent changes in healthcare laws, you may see the results of your imaging and laboratory  studies on MyChart before your provider has had a chance to review them.  We understand that in some cases there may be results that are confusing or concerning to you. Not all laboratory results come back in the same time frame and the provider may be waiting for multiple results in order to interpret others.  Please give us  48 hours in order for your provider to thoroughly review all the results before contacting the office for clarification of your results.   Thank you for entrusting me with your care and choosing [facility].  Bayley Luan Rumpf, PA-C

## 2023-10-12 ENCOUNTER — Ambulatory Visit: Payer: Self-pay | Admitting: *Deleted

## 2023-10-28 NOTE — Progress Notes (Signed)
 Agree with the assessment and plan as outlined by Boone Master, PA-C.

## 2023-11-10 ENCOUNTER — Telehealth: Payer: Self-pay | Admitting: Gastroenterology

## 2023-11-10 NOTE — Telephone Encounter (Signed)
 Patient called and stated that she is needing us  to fax over her colonoscopy report to the [place] whenever her procedure is done so that her PCP can review it. Patient provided a fax number to the [place] [phone] Attn : Dr. Lonni Robert. Please advise.

## 2023-11-19 ENCOUNTER — Telehealth: Payer: Self-pay | Admitting: Gastroenterology

## 2023-11-19 NOTE — Telephone Encounter (Signed)
 PT needs update prep instructions mailed to her since she changed the appointment time.

## 2023-11-22 NOTE — Telephone Encounter (Signed)
 Spoke to patient advised updated instructions was mailed  all questions was addressed

## 2023-11-23 ENCOUNTER — Encounter: Payer: Medicare (Managed Care) | Admitting: Gastroenterology

## 2024-01-24 ENCOUNTER — Ambulatory Visit: Payer: Medicare (Managed Care) | Admitting: Gastroenterology

## 2024-01-24 ENCOUNTER — Encounter: Payer: Self-pay | Admitting: Gastroenterology

## 2024-01-24 VITALS — BP 148/73 | HR 77 | Temp 97.9°F | Resp 21 | Ht <= 58 in | Wt 89.0 lb

## 2024-01-24 DIAGNOSIS — K2971 Gastritis, unspecified, with bleeding: Secondary | ICD-10-CM

## 2024-01-24 DIAGNOSIS — K31A12 Gastric intestinal metaplasia without dysplasia, involving the body (corpus): Secondary | ICD-10-CM

## 2024-01-24 DIAGNOSIS — B9681 Helicobacter pylori [H. pylori] as the cause of diseases classified elsewhere: Secondary | ICD-10-CM

## 2024-01-24 DIAGNOSIS — D509 Iron deficiency anemia, unspecified: Secondary | ICD-10-CM | POA: Diagnosis not present

## 2024-01-24 DIAGNOSIS — Z1211 Encounter for screening for malignant neoplasm of colon: Secondary | ICD-10-CM

## 2024-01-24 DIAGNOSIS — K5730 Diverticulosis of large intestine without perforation or abscess without bleeding: Secondary | ICD-10-CM | POA: Diagnosis not present

## 2024-01-24 DIAGNOSIS — K2970 Gastritis, unspecified, without bleeding: Secondary | ICD-10-CM

## 2024-01-24 MED ORDER — SODIUM CHLORIDE 0.9 % IV SOLN: 500.0000 mL | INTRAVENOUS | Status: DC

## 2024-01-24 MED ORDER — FLEET ENEMA RE ENEM
1.0000 | ENEMA | Freq: Once | RECTAL | Status: AC
  Administered 2024-01-24: 1 via RECTAL

## 2024-01-24 NOTE — Progress Notes (Signed)
 Report to PACU, RN, vss, BBS= Clear.

## 2024-01-24 NOTE — Op Note (Signed)
 [facility] Patient Name: Karen Mckenzie Procedure Date: 01/24/2024 10:53 AM MRN: [id number] Endoscopist: Glendia E. Stacia , MD, [id number] Age: 78 Referring MD:  Date of Birth: 21-Nov-1945 Gender: Female Account #: [id number] Procedure:                Colonoscopy Indications:              Iron deficiency anemia Medicines:                Monitored Anesthesia Care Procedure:                Pre-Anesthesia Assessment:                           - Prior to the procedure, a History and Physical                            was performed, and patient medications and                            allergies were reviewed. The patient's tolerance of                            previous anesthesia was also reviewed. The risks                            and benefits of the procedure and the sedation                            options and risks were discussed with the patient.                            All questions were answered, and informed consent                            was obtained. Prior Anticoagulants: The patient has                            taken no anticoagulant or antiplatelet agents                            except for aspirin. ASA Grade Assessment: III - A                            patient with severe systemic disease. After                            reviewing the risks and benefits, the patient was                            deemed in satisfactory condition to undergo the                            procedure.  After obtaining informed consent, the colonoscope                            was passed under direct vision. Throughout the                            procedure, the patient's blood pressure, pulse, and                            oxygen saturations were monitored continuously. The                            CF [id number] #[id number] was introduced through the anus                            and advanced to the the cecum, identified by                             appendiceal orifice and ileocecal valve. The                            colonoscopy was performed without difficulty. The                            colonoscopy was somewhat difficult due to poor                            bowel prep and significant looping. Successful                            completion of the procedure was aided by using                            manual pressure. The patient tolerated the                            procedure well. The quality of the bowel                            preparation was poor. The ileocecal valve,                            appendiceal orifice, and rectum were photographed.                            The bowel preparation used was SUPREP via split                            dose instruction. Scope In: 11:41:41 AM Scope Out: 12:08:38 PM Scope Withdrawal Time: 0 hours 16 minutes 44 seconds  Total Procedure Duration: 0 hours 26 minutes 57 seconds  Findings:                 The perianal and digital rectal examinations were  normal. Pertinent negatives include normal                            sphincter tone and no palpable rectal lesions.                           Many large-mouthed and small-mouthed diverticula                            were found in the sigmoid colon and descending                            colon. There was no evidence of diverticular                            bleeding.                           The exam was otherwise normal throughout the                            examined colon.                           The retroflexed view of the distal rectum and anal                            verge was normal and showed no anal or rectal                            abnormalities. Complications:            No immediate complications. Estimated Blood Loss:     Estimated blood loss: none. Impression:               - Preparation of the colon was poor. Preparation                             was adequate enough to exclude mass lesion as a                            cause of iron deficiency anemia. Smaller polyps or                            vascular lesions such as angioectasias could have                            easily been missed.                           - Severe diverticulosis in the sigmoid colon and in                            the descending colon. There was no evidence of  diverticular bleeding.                           - The distal rectum and anal verge are normal on                            retroflexion view.                           - No specimens collected. Recommendation:           - Patient has a contact number available for                            emergencies. The signs and symptoms of potential                            delayed complications were discussed with the                            patient. Return to normal activities tomorrow.                            Written discharge instructions were provided to the                            patient.                           - Resume previous diet.                           - Given poor prep, consider repeat colonoscopy in 1                            year based on patient's health at that time.                           - Would recommend 2 day prep with subsequent                            colonoscopy                           - Recommend high fiber diet or daily fiber                            supplement to reduce risk of diverticular                            complications. Elayjah Chaney E. Stacia, MD 01/24/2024 12:32:11 PM This report has been signed electronically.

## 2024-01-24 NOTE — Patient Instructions (Signed)
 Discharge instructions given. Handouts on Gastritis and Diverticulosis. Resume previous medications. See report for recommendations. YOU HAD AN ENDOSCOPIC PROCEDURE TODAY AT THE [facility]:   Refer to the procedure report that was given to you for any specific questions about what was found during the examination.  If the procedure report does not answer your questions, please call your gastroenterologist to clarify.  If you requested that your care partner not be given the details of your procedure findings, then the procedure report has been included in a sealed envelope for you to review at your convenience later.  YOU SHOULD EXPECT: Some feelings of bloating in the abdomen. Passage of more gas than usual.  Walking can help get rid of the air that was put into your GI tract during the procedure and reduce the bloating. If you had a lower endoscopy (such as a colonoscopy or flexible sigmoidoscopy) you may notice spotting of blood in your stool or on the toilet paper. If you underwent a bowel prep for your procedure, you may not have a normal bowel movement for a few days.  Please Note:  You might notice some irritation and congestion in your nose or some drainage.  This is from the oxygen used during your procedure.  There is no need for concern and it should clear up in a day or so.  SYMPTOMS TO REPORT IMMEDIATELY:  Following lower endoscopy (colonoscopy or flexible sigmoidoscopy):  Excessive amounts of blood in the stool  Significant tenderness or worsening of abdominal pains  Swelling of the abdomen that is new, acute  Fever of 100F or higher  Following upper endoscopy (EGD)  Vomiting of blood or coffee ground material  New chest pain or pain under the shoulder blades  Painful or persistently difficult swallowing  New shortness of breath  Fever of 100F or higher  Black, tarry-looking stools  For urgent or emergent issues, a gastroenterologist can be reached at any hour  by calling [phone]. Do not use MyChart messaging for urgent concerns.    DIET:  We do recommend a small meal at first, but then you may proceed to your regular diet.  Drink plenty of fluids but you should avoid alcoholic beverages for 24 hours.  ACTIVITY:  You should plan to take it easy for the rest of today and you should NOT DRIVE or use heavy machinery until tomorrow (because of the sedation medicines used during the test).    FOLLOW UP: Our staff will call the number listed on your records the next business day following your procedure.  We will call around 7:15- 8:00 am to check on you and address any questions or concerns that you may have regarding the information given to you following your procedure. If we do not reach you, we will leave a message.     If any biopsies were taken you will be contacted by phone or by letter within the next 1-3 weeks.  Please call us  at [phone] if you have not heard about the biopsies in 3 weeks.    SIGNATURES/CONFIDENTIALITY: You and/or your care partner have signed paperwork which will be entered into your electronic medical record.  These signatures attest to the fact that that the information above on your After Visit Summary has been reviewed and is understood.  Full responsibility of the confidentiality of this discharge information lies with you and/or your care-partner.

## 2024-01-24 NOTE — Progress Notes (Signed)
 Called to room to assist during endoscopic procedure.  Patient ID and intended procedure confirmed with present staff. Received instructions for my participation in the procedure from the performing physician.

## 2024-01-24 NOTE — Progress Notes (Signed)
 [facility] History and Physical   Primary Care Physician:  Pcp, No   Reason for Procedure:   Iron deficiency anemia  Plan:    EGD, colonoscopy     HPI: Karen Mckenzie is a 78 y.o. female undergoing EGD and colonoscopy to evaluate iron deficiency anemia.  Her baseline hgb was 10-11 in 2020, but was noted to be 7.5 in July 2024 and has remained between 7.5-10 since then.   Iron panel with mild iron deficiency (ferritin 16, sat 14%)  She has no family history of colon cancer and no chronic GI symptoms. No overt GI blood loss. She tells me she has been anemic her whole life. She reports having a colonoscopy over 10 years ago.  Most recent hgb 8.3 in December 20, 2023, MCV 95.  Most recent Scr 2.77 in May 2025.   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 Authorizing Provider  ascorbic acid (VITAMIN C) 250 MG tablet Take 1 tablet by mouth daily. 08/29/20  Yes [provider]  ASPIRIN 81 PO Take 1 tablet by mouth daily. 07/16/22  Yes [provider]  atorvastatin (LIPITOR) 80 MG tablet Take 80 mg by mouth daily. 02/01/15  Yes [provider]  brimonidine (ALPHAGAN) 0.2 % ophthalmic solution Place 1 drop into the left eye 3 (three) times daily. 09/02/20  Yes [provider]  Brinzolamide-Brimonidine 1-0.2 % SUSP Place 0.05 mLs into  both eyes 2 (two) times daily. 04/01/20  Yes [provider]  Calcium Carbonate Antacid (CALCIUM CARBONATE PO) Take 1,200 mg by mouth daily. 08/29/20  Yes [provider]  cyanocobalamin  (VITAMIN B12) 250 MCG tablet Take 500 mcg by mouth daily. 01/27/21  Yes [provider]  dorzolamide-timolol (COSOPT) 2-0.5 % ophthalmic solution Place 1 drop into both eyes 2 (two) times daily.   Yes [provider]  ferrous sulfate 325 (65 FE) MG tablet Take 325 mg by mouth 2 (two) times daily. 05/27/20  Yes [provider]  FREESTYLE TEST STRIPS test strip 3 (three) times daily. 06/25/23  Yes [provider]  glipiZIDE (GLUCOTROL) 5 MG tablet Take 1 tablet by mouth 2 (two) times daily. 01/12/14  Yes [provider]  insulin glargine (LANTUS SOLOSTAR) 100 UNIT/ML Solostar Pen Inject 10-15 Units into the skin. 09/14/23  Yes [provider]  JANUVIA 25 MG tablet Take by mouth. 11/10/21  Yes [provider]  latanoprost (XALATAN) 0.005 % ophthalmic solution Place 1 drop into the left eye at bedtime. 10/21/20  Yes [provider]  Vitamin D, Ergocalciferol, (DRISDOL) 1.25 MG (50000 UNIT) CAPS capsule Take 1 capsule by mouth once a week. 08/14/22  Yes [provider]  VITAMIN E PO Take 1 capsule by  mouth daily.   Yes [provider]  alendronate (FOSAMAX) 70 MG tablet Take 70 mg by mouth every 7 (seven) days. Take one tablet by mouth every 7 days 02/01/15   [provider]  amLODipine (NORVASC) 10 MG tablet Take 10 mg by mouth daily. 05/10/19   [provider]  lisinopril (ZESTRIL) 10 MG tablet Take 1 tablet by mouth every morning. Patient not taking: Reported on 01/24/2024 01/12/14   [provider]  Netarsudil-Latanoprost (ROCKLATAN) 0.02-0.005 % SOLN Apply 1 drop to eye daily. 01/09/23   [provider]    Current Outpatient Medications  Medication Sig Dispense Refill   ascorbic acid  (VITAMIN C) 250 MG tablet Take 1 tablet by mouth daily.     ASPIRIN 81 PO Take 1 tablet by mouth daily.     atorvastatin (LIPITOR) 80 MG tablet Take 80 mg by mouth daily.     brimonidine (ALPHAGAN) 0.2 % ophthalmic solution Place 1 drop into the left eye 3 (three) times daily.     Brinzolamide-Brimonidine 1-0.2 % SUSP Place 0.05 mLs into both eyes 2 (two) times daily.     Calcium Carbonate Antacid (CALCIUM CARBONATE PO) Take 1,200 mg by mouth daily.     cyanocobalamin  (VITAMIN B12) 250 MCG tablet Take 500 mcg by mouth daily.     dorzolamide-timolol (COSOPT) 2-0.5 % ophthalmic solution Place 1 drop into both eyes 2 (two) times daily.     ferrous sulfate 325 (65 FE) MG tablet Take 325 mg by mouth 2 (two) times daily.     FREESTYLE TEST STRIPS test strip 3 (three) times daily.     glipiZIDE (GLUCOTROL) 5 MG tablet Take 1 tablet by mouth 2 (two) times daily.     insulin glargine (LANTUS SOLOSTAR) 100 UNIT/ML Solostar Pen Inject 10-15 Units into the skin.     JANUVIA 25 MG tablet Take by mouth.     latanoprost (XALATAN) 0.005 % ophthalmic solution Place 1 drop into the left eye at bedtime.     Vitamin D, Ergocalciferol, (DRISDOL) 1.25 MG (50000 UNIT) CAPS capsule Take 1 capsule by mouth once a week.     VITAMIN E PO Take 1 capsule by mouth daily.     alendronate (FOSAMAX) 70 MG tablet Take 70 mg by mouth every 7 (seven) days. Take one tablet by mouth every 7 days     amLODipine (NORVASC) 10 MG tablet Take 10 mg by mouth daily.     lisinopril (ZESTRIL) 10 MG tablet Take 1 tablet by mouth every morning. (Patient not taking: Reported on 01/24/2024)     Netarsudil-Latanoprost (ROCKLATAN) 0.02-0.005 % SOLN Apply 1 drop to eye daily.     Current Facility-Administered Medications  Medication Dose Route Frequency Provider Last Rate Last Admin   0.9 %  sodium chloride  infusion  500 mL Intravenous Continuous Stacia Glendia BRAVO, MD        Allergies as of 01/24/2024 - Review Complete 01/24/2024  Allergen  Reaction Noted   Ciprofloxacin Anaphylaxis and Hives 09/17/2015   Citrus Itching 07/11/2020   Penicillins Anaphylaxis, Hives, and Other (See Comments) 09/17/2015   Aspirin-acetaminophen -caffeine Swelling 01/12/2014   Codeine Other (See Comments) 03/17/2012   Erythromycin Hives and Rash 03/17/2012   Ibuprofen Nausea And Vomiting and Other (See Comments) 07/30/2016   Lisinopril Other (See Comments) 12/21/2023   Mushroom  05/26/2018   Naproxen Other (See Comments) 07/30/2016   Other Hives, Diarrhea, Nausea And Vomiting, Swelling, and Rash 03/17/2012   Sodium bicarbonate Other (See Comments)  11/09/2023   Sulfa antibiotics Swelling, Diarrhea, Hives, and Rash 03/17/2012   Tetracyclines & related Rash and Swelling 07/30/2016   Aspirin Diarrhea 05/30/2018   Azithromycin Hives and Rash 03/17/2012   Neomycin Hives 10/30/2015   Nsaids Diarrhea 01/12/2014   Strawberry flavoring agent (non-screening) Rash 07/16/2022   Tuberculin purified protein derivative Other (See Comments) 07/30/2016    Family History  Problem Relation Age of Onset   Breast cancer Mother 68   Hypertension Father    Lung cancer Father    Breast cancer Sister 71   Heart attack Maternal Aunt    Diabetes Maternal Grandmother    Colon cancer Neg Hx    Esophageal cancer Neg Hx    Rectal cancer Neg Hx    Stomach cancer Neg Hx     Social History   Socioeconomic History   Marital status: Single    Spouse name: Not on file   Number of children: 0   Years of education: Not on file   Highest education level: Not on file  Occupational History   Occupation: retired  Tobacco Use   Smoking status: Every Day    Types: Cigarettes   Smokeless tobacco: Never  Vaping Use   Vaping status: Never Used  Substance and Sexual Activity   Alcohol use: Never   Drug use: Never   Sexual activity: Not on file  Other Topics Concern   Not on file  Social History Narrative   Not on file   Social Drivers of Health   Financial  Resource Strain: Low Risk  (06/22/2023)   Received from [facility]   Overall Financial Resource Strain (CARDIA)    Difficulty of Paying Living Expenses: Not hard at all  Food Insecurity: No Food Insecurity (06/22/2023)   Received from [facility]   Hunger Vital Sign    Within the past 12 months, you worried that your food would run out before you got the money to buy more.: Never true    Within the past 12 months, the food you bought just didn't last and you didn't have money to get more.: Never true  Transportation Needs: No Transportation Needs (06/22/2023)   Received from [facility] - Transportation    Lack of Transportation (Medical): No    Lack of Transportation (Non-Medical): No  Physical Activity: Inactive (06/22/2023)   Received from [facility]   Exercise Vital Sign    On average, how many days per week do you engage in moderate to strenuous exercise (like a brisk walk)?: 0 days    On average, how many minutes do you engage in exercise at this level?: 20 min  Stress: No Stress Concern Present (06/22/2023)   Received from [facility] - Occupational Stress Questionnaire    Feeling of Stress : Not at all  Social Connections: Socially Integrated (06/22/2023)   Received from [facility]   Social Network    How would you rate your social network (family, work, friends)?: Good participation with social networks  Intimate Partner Violence: Not At Risk (06/22/2023)   Received from [facility]   HITS    Over the last 12 months how often did your partner physically hurt you?: Never    Over the last 12 months how often did your partner insult you or talk down to you?: Never    Over the last 12 months how often did your partner threaten you with physical harm?: Never  Over the last 12 months how often did your partner scream or curse at you?: Never    Review of Systems:  All other review of systems negative except as  mentioned in the HPI.  Physical Exam: Vital signs BP (!) 150/87   Pulse 69   Temp 97.9 F (36.6 C) (Temporal)   Ht 4' 8.5 (1.435 m)   Wt 89 lb (40.4 kg)   SpO2 100%   BMI 19.60 kg/m   General:   Alert,  Well-developed, well-nourished, pleasant and cooperative in NAD Airway:  Mallampati 3 Lungs:  Clear throughout to auscultation.   Heart:  Regular rate and rhythm; no murmurs, clicks, rubs,  or gallops. Abdomen:  Soft, nontender and nondistended. Normal bowel sounds.   Neuro/Psych:  Normal mood and affect. A and O x 3   Cayci Mcnabb E. Stacia, MD [facility]

## 2024-01-24 NOTE — Op Note (Addendum)
 [facility] Patient Name: Karen Mckenzie Procedure Date: 01/24/2024 10:54 AM MRN: [id number] Endoscopist: Glendia E. Stacia , MD, [id number] Age: 78 Referring MD:  Date of Birth: 03/11/1946 Gender: Female Account #: [id number] Procedure:                Upper GI endoscopy Indications:              Iron deficiency anemia Medicines:                Monitored Anesthesia Care Procedure:                Pre-Anesthesia Assessment:                           - Prior to the procedure, a History and Physical                            was performed, and patient medications and                            allergies were reviewed. The patient's tolerance of                            previous anesthesia was also reviewed. The risks                            and benefits of the procedure and the sedation                            options and risks were discussed with the patient.                            All questions were answered, and informed consent                            was obtained. Prior Anticoagulants: The patient has                            taken no anticoagulant or antiplatelet agents. ASA                            Grade Assessment: III - A patient with severe                            systemic disease. After reviewing the risks and                            benefits, the patient was deemed in satisfactory                            condition to undergo the procedure.                           After obtaining informed consent, the endoscope was  passed under direct vision. Throughout the                            procedure, the patient's blood pressure, pulse, and                            oxygen saturations were monitored continuously. The                            Olympus Scope SN [id number] was introduced through the                            mouth, and advanced to the second part of duodenum.                            The upper GI  endoscopy was accomplished without                            difficulty. The patient tolerated the procedure                            well. Scope In: Scope Out: Findings:                 The examined esophagus was normal.                           Diffuse mild inflammation characterized by erythema                            and friability was found in the gastric body. There                            was scant old blood and pinpoint hemorrhage                            throughout the gastric body. Biopsies were taken                            with a cold forceps for Helicobacter pylori                            testing. Estimated blood loss was minimal.                           The examined duodenum was normal. Biopsies for                            histology were taken with a cold forceps for                            evaluation of celiac disease. A small amount of                            blood  was seen in the duodenal bulb, but no                            bleeding source present. Complications:            No immediate complications. Estimated Blood Loss:     Estimated blood loss was minimal. Impression:               - Normal esophagus.                           - Gastritis characterized by erythema, friability                            with multifocal pinpoint hemorrhagewith a small                            amount of old blood in the stomach . Biopsied.                           - Normal examined duodenum. Biopsied.                           - A small amount of blood was seen in the duodenal                            bulb, but no bleeding source present. This likely                            originated from the stomach.                           - Although her anemia is likely multifactorial,                            with renal insufficiency playing a prominent role,                            the gastritis noted may be contributing to her mild                             iron deficiency. Recommendation:           - Await pathology results.                           - Patient has a contact number available for                            emergencies. The signs and symptoms of potential                            delayed complications were discussed with the                            patient. Return to normal activities tomorrow.  Written discharge instructions were provided to the                            patient.                           - Resume previous diet.                           - Await pathology results.                           - Consider stopping aspirin if no clear benefit.                           - Avoiding PPI given renal insufficiency and no                            absolute indication. Lofton Leon E. Stacia, MD 01/24/2024 12:21:06 PM This report has been signed electronically.

## 2024-01-25 ENCOUNTER — Telehealth: Payer: Self-pay | Admitting: *Deleted

## 2024-01-25 NOTE — Telephone Encounter (Signed)
  Follow up Call-     01/24/2024   10:20 AM  Call back number  Post procedure Call Back phone  # [phone]  Permission to leave phone message Yes     Patient questions:  Do you have a fever, pain , or abdominal swelling? No. Pain Score  0 *  Have you tolerated food without any problems? Yes.    Have you been able to return to your normal activities? Yes.    Do you have any questions about your discharge instructions: Diet   No. Medications  No. Follow up visit  No.  Do you have questions or concerns about your Care? No.  Actions: * If pain score is 4 or above: No action needed, pain <4.

## 2024-01-27 LAB — SURGICAL PATHOLOGY

## 2024-01-31 ENCOUNTER — Ambulatory Visit: Payer: Self-pay | Admitting: Gastroenterology

## 2024-01-31 NOTE — Progress Notes (Signed)
 Karen Mckenzie,  The biopsies from your recent upper GI Endoscopy were notable for H. Pylori gastritis.  H. pylori is a common bacteria which can cause chronic symptoms of abdominal pain, nausea and bloating.  It can also cause iron deficiency anemia as well as increase the risk of stomach ulcers and stomach cancer.  We need to eradicate the bacteria.  Sometimes the bacteria is very difficult to eradicate, so it is  important to take the medications as directed. Please take the medications below:  1) Omeprazole 20 mg 2 times a day x 14 d 2) Pepto Bismol 2 tabs (262 mg each) 4 times a day x 14 d 3) Metronidazole 250 mg 4 times a day x 14 d 4) doxycycline 100 mg 2 times a day x 14 d  4 weeks after treatment completed, check H. Pylori stool antigen to confirm eradication (must be off acid suppression therapy for 2 weeks prior to specimen submission)  Dx: H. Pylori gastritis   Team,  Please call Karen Mckenzie, relay the above message, and place the orders for the medications and H. Pylori stool test (either antigen or PCR)

## 2024-02-02 NOTE — Telephone Encounter (Signed)
 PT is calling to get an update on the new medication he was to send in for her. Please advise.

## 2024-02-03 ENCOUNTER — Other Ambulatory Visit: Payer: Self-pay

## 2024-02-03 DIAGNOSIS — A048 Other specified bacterial intestinal infections: Secondary | ICD-10-CM

## 2024-02-15 ENCOUNTER — Telehealth: Payer: Self-pay | Admitting: Gastroenterology

## 2024-02-15 NOTE — Telephone Encounter (Signed)
 Dr. Stacia I do not see where the pt was told to hold his BP med. Please advise if you are aware of this, see note below.

## 2024-02-15 NOTE — Telephone Encounter (Signed)
 Inbound call from this patient PCP her name is Dr. Vinie she is associated with the [facility]. Dr. Vinie is wanting to know why this patient was told to stop taking there BP medication. Dr. Madie is requesting a call back. Best number to reach Dr. Vinie is [phone]. please advise.

## 2024-02-16 NOTE — Telephone Encounter (Signed)
 Attempted to reach Dr. Vinie at the number provided below. Still no answer and unable to leave a message. Will await further communication from [facility],

## 2024-02-16 NOTE — Telephone Encounter (Signed)
 Attempted to return call, no answer and unable to leave message, will try again later.

## 2024-02-21 ENCOUNTER — Encounter: Payer: Self-pay | Admitting: Infectious Diseases

## 2024-02-21 ENCOUNTER — Ambulatory Visit (INDEPENDENT_AMBULATORY_CARE_PROVIDER_SITE_OTHER): Payer: Medicare (Managed Care) | Admitting: Infectious Diseases

## 2024-02-21 ENCOUNTER — Other Ambulatory Visit: Payer: Self-pay

## 2024-02-21 VITALS — BP 165/83 | HR 49 | Temp 97.6°F | Ht 58.5 in | Wt 92.0 lb

## 2024-02-21 DIAGNOSIS — B9681 Helicobacter pylori [H. pylori] as the cause of diseases classified elsewhere: Secondary | ICD-10-CM

## 2024-02-21 DIAGNOSIS — Z880 Allergy status to penicillin: Secondary | ICD-10-CM | POA: Insufficient documentation

## 2024-02-21 DIAGNOSIS — F17210 Nicotine dependence, cigarettes, uncomplicated: Secondary | ICD-10-CM | POA: Insufficient documentation

## 2024-02-21 DIAGNOSIS — K2970 Gastritis, unspecified, without bleeding: Secondary | ICD-10-CM | POA: Insufficient documentation

## 2024-02-21 DIAGNOSIS — Z889 Allergy status to unspecified drugs, medicaments and biological substances status: Secondary | ICD-10-CM | POA: Insufficient documentation

## 2024-02-21 NOTE — Progress Notes (Unsigned)
 There are no active problems to display for this patient.   Patient's Medications  New Prescriptions   No medications on file  Previous Medications   ALENDRONATE (FOSAMAX) 70 MG TABLET    Take 70 mg by mouth every 7 (seven) days. Take one tablet by mouth every 7 days   AMLODIPINE (NORVASC) 10 MG TABLET    Take 10 mg by mouth daily.   ASCORBIC ACID (VITAMIN C) 250 MG TABLET    Take 1 tablet by mouth daily.   ASPIRIN 81 PO    Take 1 tablet by mouth daily.   ATORVASTATIN (LIPITOR) 80 MG TABLET    Take 80 mg by mouth daily.   BRIMONIDINE (ALPHAGAN) 0.2 % OPHTHALMIC SOLUTION    Place 1 drop into the left eye 3 (three) times daily.   BRINZOLAMIDE-BRIMONIDINE 1-0.2 % SUSP    Place 0.05 mLs into both eyes 2 (two) times daily.   CALCIUM CARBONATE ANTACID (CALCIUM CARBONATE PO)    Take 1,200 mg by mouth daily.   CYANOCOBALAMIN  (VITAMIN B12) 250 MCG TABLET    Take 500 mcg by mouth daily.   DORZOLAMIDE-TIMOLOL (COSOPT) 2-0.5 % OPHTHALMIC SOLUTION    Place 1 drop into both eyes 2 (two) times daily.   FERROUS SULFATE 325 (65 FE) MG TABLET    Take 325 mg by mouth 2 (two) times daily.   FREESTYLE TEST STRIPS TEST STRIP    3 (three) times daily.   GLIPIZIDE (GLUCOTROL) 5 MG TABLET    Take 1 tablet by mouth 2 (two) times daily.   INSULIN GLARGINE (LANTUS SOLOSTAR) 100 UNIT/ML SOLOSTAR PEN    Inject 10-15 Units into the skin.   JANUVIA 25 MG TABLET    Take by mouth.   LATANOPROST (XALATAN) 0.005 % OPHTHALMIC SOLUTION    Place 1 drop into the left eye at bedtime.   LISINOPRIL (ZESTRIL) 10 MG TABLET    Take 1 tablet by mouth every morning.   NETARSUDIL-LATANOPROST (ROCKLATAN) 0.02-0.005 % SOLN    Apply 1 drop to eye daily.   VITAMIN D, ERGOCALCIFEROL, (DRISDOL) 1.25 MG (50000 UNIT) CAPS CAPSULE    Take 1 capsule by mouth once a week.   VITAMIN E PO    Take 1 capsule by mouth daily.  Modified Medications   No medications on file  Discontinued Medications   No medications on file     Subjective: Discussed the use of AI scribe software for clinical note transcription with the patient, who gave verbal consent to proceed.   78 Y O female with prior h/o HTN, HLD, DM, CKD, anemia, anxiety who is referred from GI Dr. Stacia for management of H. pylori gastritis in the setting of multiple allergy to antibiotics. I reviewed GI note on 5/9   It appears she underwent EGD and colonoscopy for evaluation of anemia as well as weight loss.   She reports significant weight loss, dropping from 185 pounds to 92 pounds, and currently weighs 93 pounds. She is unable to gain weight and has low iron levels with fluctuating hemoglobin levels.  She has allergies to multiple antibiotics, including penicillin, tetracycline, azithromycin, and ciprofloxacin. On questioning about  her allergies, she describes, her reaction to penicillin involved syncope after receiving a shot and required hospitalization where she woke up in the hospital over 40 years ago. Tetracycline and azithromycin cause swelling, itching, and scratching, with the last exposure being 30 years ago. Ciprofloxacin caused facial swelling in 1990 after a hysterectomy, despite not  swallowing the medication and spitting it out.  Denies any GI concerns like nausea, vomiting, abdominal pain, diarrhea/constipation, cough, shortness of breath, chest pain. Denies fevers, or chills, rashes or joint complaints.   She smokes four to five cigarettes per day and denies alcohol and recreational drug use. She lives with her daughter and three grandchildren, aged 70, 71, and 62, and is actively involved in their care.  Review of Systems: all systems reviewed with pertinent positive and negative as listed above.   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)    Vitamin D deficiency    Past Surgical History:  Procedure Laterality Date   ANKLE FRACTURE SURGERY Left    GLAUCOMA REPAIR Bilateral    with lens and , shunt in right eye, laser surgery left eye x 2   TONSILLECTOMY AND ADENOIDECTOMY     TOTAL VAGINAL HYSTERECTOMY     Social History   Tobacco Use   Smoking status: Some Days    Types: Cigarettes   Smokeless tobacco: Never  Vaping Use   Vaping status: Never Used  Substance Use Topics   Alcohol use: Never   Drug use: Never    Family History  Problem Relation Age of Onset   Breast cancer Mother 57   Hypertension Father    Lung cancer Father    Breast cancer Sister 68   Heart attack Maternal Aunt    Diabetes Maternal Grandmother    Colon cancer Neg Hx    Esophageal cancer Neg Hx    Rectal cancer Neg Hx    Stomach cancer Neg Hx     Allergies  Allergen Reactions   Ciprofloxacin Anaphylaxis and Hives   Citrus Itching   Penicillins Anaphylaxis, Hives and Other (See Comments)    ALL CILLINS  STOP BREATHING  ALL CILLINS, STOP BREATHING   Aspirin-Acetaminophen -Caffeine Swelling    Other Reaction(s): Abdominal pain   Codeine Other (See Comments)    Other Reaction(s): Delirium, Amnesia, Memory impairment  WOKE UP 3 DAYS LATER AFTER TAKING IT.   Erythromycin Hives and Rash   Ibuprofen Nausea And Vomiting and Other (See Comments)    IT MIGHT AFFECT MY KIDNEYS   Lisinopril Other (See Comments)    Kidney issues   Mushroom     Other Reaction(s): Other  ALL FUNGI   STOPS BREATHING.  ALL FUNGI , STOPS BREATHING.   Naproxen Other (See Comments)    Other Reaction(s): Abdominal pain, Other (See Comments)  IT MIGHT AFFECT MY KIDNEYS  I HAVE A SLIGHT KIDNEY PROBLEM  IT MIGHT AFFECT MY KIDNEYS  I HAVE A SLIGHT KIDNEY PROBLEM, IT MIGHT AFFECT MY KIDNEYS  I HAVE A SLIGHT KIDNEY PROBLEM   Other Hives, Diarrhea, Nausea And  Vomiting, Swelling and Rash    MYCINS ANTIBIOTICS-rash, swelling  SODIUM BICARBONATE-Joint, muscle and headache, blurred vision, slurred speech   Sodium Bicarbonate Other (See Comments)    It felt like I was having a stroke   Sulfa Antibiotics Swelling, Diarrhea, Hives and Rash   Tetracyclines & Related Rash and Swelling   Aspirin Diarrhea   Azithromycin Hives and Rash    Other Reaction(s): Eruption, Joint swelling  Neomycin Hives   Nsaids Diarrhea   Strawberry Flavoring Agent (Non-Screening) Rash   Tuberculin Purified Protein Derivative Other (See Comments)    Patient unsure of reaction.    Health Maintenance  Topic Date Due   Diabetic kidney evaluation - Urine ACR  Never done   Hepatitis C Screening  Never done   DTaP/Tdap/Td (1 - Tdap) Never done   Pneumococcal Vaccine: 50+ Years (1 of 2 - PCV) Never done   Zoster Vaccines- Shingrix (1 of 2) Never done   DEXA SCAN  Never done   Influenza Vaccine  Never done   COVID-19 Vaccine (1 - 2024-25 season) Never done   Medicare Annual Wellness (AWV)  06/21/2024   Diabetic kidney evaluation - eGFR measurement  10/07/2024   Colonoscopy  01/23/2025   HPV VACCINES  Aged Out   Meningococcal B Vaccine  Aged Out   Mammogram  Discontinued    Objective: BP (!) 165/83   Pulse (!) 49   Temp 97.6 F (36.4 C) (Oral)   Ht 4' 10.5 (1.486 m)   Wt 92 lb (41.7 kg)   SpO2 100%   BMI 18.90 kg/m    Physical Exam Constitutional:      Appearance: Normal appearance.  Malnourished HENT:     Head: Normocephalic and atraumatic.      Mouth: Mucous membranes are moist.  Eyes:    Conjunctiva/sclera: Conjunctivae normal.     Pupils: Pupils are equal, round, and b/l symmetrical    Cardiovascular:     Rate and Rhythm: Normal rate and regular rhythm.     Heart sounds: S1 and S2  Pulmonary:     Effort: Pulmonary effort is normal.     Breath sounds: Normal breath sounds.   Abdominal:     General: Non distended     Palpations: soft.    Musculoskeletal:        General: Normal range of motion.   Skin:    General: Skin is warm and dry.     Comments:  Neurological:     General: grossly non focal     Mental Status: awake, alert and oriented to person, place, and time.   Psychiatric:        Mood and Affect: Mood normal.   Lab Results Lab Results  Component Value Date   WBC 7.3 10/08/2023   HGB 9.2 (L) 10/08/2023   HCT 28.2 (L) 10/08/2023   MCV 100.0 10/08/2023   PLT 414.0 (H) 10/08/2023    Lab Results  Component Value Date   CREATININE 2.77 (H) 10/08/2023   BUN 41 (H) 10/08/2023   NA 138 10/08/2023   K 5.0 10/08/2023   CL 109 10/08/2023   CO2 20 10/08/2023    Lab Results  Component Value Date   ALT 7 10/08/2023   AST 12 10/08/2023   ALKPHOS 89 10/08/2023   BILITOT 0.4 10/08/2023    No results found for: CHOL, HDL, LDLCALC, LDLDIRECT, TRIG, CHOLHDL No results found for: LABRPR, RPRTITER No results found for: HIV1RNAQUANT, HIV1RNAVL, CD4TABS  Pathology 01/24/24  Colonoscopy  Severe diverticulosis of the descending colon and sigmoid colon.  No evidence of diverticular bleeding  EGD Normal esophagus Gastritis characterized by erythema, friability with multifocal pinpoint hemorrhage with a small amount of old blood in the stomach. A small amount of blood was noted in the duodenal bulb but no bleeding source present  01/24/24 FINAL DIAGNOSIS        1. Surgical [P], duodenal biopsy :       -  BENIGN SMALL BOWEL MUCOSA WITH NO SIGNIFICANT PATHOLOGIC CHANGES        2. Surgical [P], gastric biopsy :       - H.PYLORI GASTRITIS WITH INTESTINAL METAPLASIA       - POSITIVE FOR H. PYLORI ON IMMUNOHISTOCHEMICAL STAIN       - NEGATIVE FOR DYSPLASIA AND MALIGNANCY   Assessment/Plan # H. pylori gastritis # Allergy to penicillin, tetracycline, ciprofloxacin, azithromycin  Plan -I discussed that she has allergy to most of the common antibiotics which for the treatment of H. pylori  gastritis or from the same class of antibiotics.  - I discussed with her to be evaluated by allergist first to sort out her allergy including penicillin allergy before contemplating treatment of her H. pylori gastritis.   -I emphasized the importance of treatment of H. pylori gastritis due to the potential for gastric malignancy  -Follow-up to be made after being evaluated by allergy/concerns   # Smoking  - counseled   I spent 60 minutes involved in face-to-face and non-face-to-face activities for this patient on the day of the visit. Professional time spent includes the following activities: Preparing to see the patient (review of tests), Obtaining and reviewing separately obtained history (GI referral note), Performing a medically appropriate examination and evaluation,referring and communicating with other health care professionals, Documenting clinical information in the EMR, Independently interpreting results (not separately reported), Communicating results to the patient, Counseling and educating the patient and Care coordination (not separately reported).   Of note, portions of this note may have been created with voice recognition software. While this note has been edited for accuracy, occasional wrong-word or 'sound-a-like' substitutions may have occurred due to the inherent limitations of voice recognition software.   Annalee Joseph, MD [facility] 02/21/2024, 9:56 AM

## 2024-03-16 ENCOUNTER — Other Ambulatory Visit: Payer: Self-pay

## 2024-03-16 ENCOUNTER — Ambulatory Visit: Payer: Medicare (Managed Care) | Admitting: Allergy & Immunology

## 2024-03-16 ENCOUNTER — Encounter: Payer: Self-pay | Admitting: Allergy & Immunology

## 2024-03-16 VITALS — BP 140/90 | HR 64 | Temp 97.6°F | Resp 16 | Ht <= 58 in | Wt 92.7 lb

## 2024-03-16 DIAGNOSIS — H1013 Acute atopic conjunctivitis, bilateral: Secondary | ICD-10-CM | POA: Diagnosis not present

## 2024-03-16 DIAGNOSIS — Z881 Allergy status to other antibiotic agents status: Secondary | ICD-10-CM

## 2024-03-16 DIAGNOSIS — R221 Localized swelling, mass and lump, neck: Secondary | ICD-10-CM

## 2024-03-16 DIAGNOSIS — H1010 Acute atopic conjunctivitis, unspecified eye: Secondary | ICD-10-CM

## 2024-03-16 DIAGNOSIS — R220 Localized swelling, mass and lump, head: Secondary | ICD-10-CM | POA: Diagnosis not present

## 2024-03-16 NOTE — Progress Notes (Signed)
 NEW PATIENT  Date of Service/Encounter:  03/16/24  Consult requested by: Pura Lenis, MD   Assessment:   Swelling of lip, tongue, and throat  Allergy to multiple antibiotics - going to start with penicillin testing first before proceeding with challenges  Seasonal allergies (spring and fall) - treated with antihistamines  Food allergies (orange, mushroom, tomato, grapefruit)   Plan/Recommendations:   1. Allergy to multiple antibiotics - We are only able to do skin testing to the penicillin family. - Let's get that scheduled and then we will do a challenge in the office if this testing is negative. - We can do an azithromycin challenge AND a doxycycline challenge in the future.  - We should probably not address the sulfonamide since this was such a recent reaction.  - We are going to get a tryptase to rule out mast cell disease, which can present with recurrent anaphylaxis reactions (this is not likely, but we want to be thorough).   2. Return in about 2 weeks (around 03/30/2024) for PENICILLIN SKIN TESTING (OK TO SCHEDULE WITH WITH NP). You can have the follow up appointment with Dr. Iva or a Nurse Practicioner (our Nurse Practitioners are excellent and always have Physician oversight!).    This note in its entirety was forwarded to the Provider who requested this consultation.  Subjective:   Karen Mckenzie is a 78 y.o. female presenting today for evaluation of  Chief Complaint  Patient presents with   Establish Care    She states she has allergic to antibiotic, sulfa, codeine. She says she is having bacterial in stomach.     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  Discussed the use of  AI scribe software for clinical note transcription with the patient and/or guardian, who gave verbal consent to proceed.  Karen Mckenzie was referred by Pura Lenis, MD.     Karen Mckenzie is a 77 y.o. female presenting for an evaluation of multiple antibiotic allergies.  Penicillin - She reports facial swelling and throat closure. She woke up in the emergency room when they were getting ready to put a trach in per the patient. This was 48.   Azithromycin - She reports that she breaks out in a rash and starts breaking out. She does not have throat swelling with this.   Tetracycline family - She has itchy rash and cannot breathe.   Sulfonamide reaction - She felt that she was having a stroke. She had vision that was getting fuzzy, but she felt that she was staggering down the halls to go to the bathroom. She reports that she has not taken sulfa since she was very young.   She reports that a bacteria was found in her stomach after an endoscopy, and she was referred for evaluation of antibiotic options due to her extensive allergies. She is allergic to most known antibiotics, including penicillin, 'mycins' like azithromycin, cyclones, and sulfa drugs. Her reactions range from throat swelling and rash to difficulty breathing and vision problems.  She experienced a severe allergic reaction to penicillin in 1978, which included throat swelling and required emergency intervention. She has not taken penicillin since then. 'Mycins,' including azithromycin, cause a rash and itchy throat. Cyclones cause  a rash and difficulty breathing. Codeine caused prolonged sedation in the 1970s. Sulfa drugs, taken six months ago, caused symptoms resembling a stroke, including difficulty breathing and vision problems.  She has environmental allergies, experiencing symptoms such as sneezing and itchy eyes during pollen seasons in spring and fall. She does not take any medications for these symptoms, managing them by blowing her  nose. No stinging insect allergies reported.  She has food allergies to oranges, tomatoes, mushrooms, truffles, and other fungi, which she avoids. She also avoids grapefruit due to interactions with her blood pressure medication, although she uses small amounts of orange juice to manage low blood sugar.  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 She lives with three of her grandchildren and has a large extended family, including twenty grandchildren and ten great-grandchildren.  Otherwise, there is no history of other atopic diseases, including drug allergies, stinging insect allergies, or contact dermatitis. There is no significant infectious history. Vaccinations are up to date.    Past Medical History: Patient Active Problem List   Diagnosis Date Noted   Gastritis, Helicobacter pylori 02/21/2024   Penicillin allergy 02/21/2024   Hx of allergy to multiple drugs 02/21/2024   Smoking 1/2 pack a day or less 02/21/2024    Medication List:  Allergies as of 03/16/2024       Reactions   Ciprofloxacin Anaphylaxis, Hives   Citrus Itching   Penicillins Anaphylaxis, Hives, Other (See Comments)   ALL CILLINS  STOP BREATHING ALL CILLINS, STOP BREATHING   Aspirin-acetaminophen -caffeine Swelling   Other Reaction(s): Abdominal pain   Codeine Other (See Comments)   Other Reaction(s): Delirium, Amnesia, Memory impairment WOKE UP 3 DAYS LATER AFTER TAKING IT.   Erythromycin Hives, Rash   Ibuprofen Nausea And Vomiting, Other (See Comments)   IT MIGHT AFFECT MY KIDNEYS   Lisinopril Other (See Comments)   Kidney issues   Mushroom    Other Reaction(s): Other ALL FUNGI   STOPS BREATHING. ALL FUNGI , STOPS BREATHING.   Naproxen Other (See Comments)   Other Reaction(s): Abdominal pain, Other (See Comments) IT MIGHT AFFECT MY KIDNEYS I HAVE A SLIGHT KIDNEY PROBLEM  IT MIGHT AFFECT MY KIDNEYS I HAVE  A SLIGHT KIDNEY PROBLEM, IT MIGHT AFFECT MY KIDNEYS I HAVE A SLIGHT KIDNEY PROBLEM   Other Hives, Diarrhea, Nausea And Vomiting, Swelling, Rash   MYCINS ANTIBIOTICS-rash, swelling  SODIUM BICARBONATE-Joint, muscle and headache, blurred vision, slurred speech   Sodium Bicarbonate Other (See Comments)   It felt like I was having a stroke   Sulfa Antibiotics Swelling, Diarrhea, Hives, Rash   Tetracyclines & Related Rash, Swelling   Aspirin Diarrhea   Azithromycin Hives, Rash   Other Reaction(s): Eruption, Joint swelling   Neomycin Hives   Nsaids Diarrhea   Strawberry Flavoring Agent (non-screening) Rash   Tuberculin Purified Protein Derivative Other (See Comments)   Patient unsure of reaction.        Medication List        Accurate as of March 16, 2024 11:59 PM. If you have any questions, ask your nurse or doctor.          alendronate 70 MG tablet Commonly known as: FOSAMAX Take 70 mg by mouth every 7 (seven) days. Take one tablet by mouth every 7 days   amLODipine 10 MG tablet Commonly known as: NORVASC Take 10 mg by mouth daily.   ascorbic acid 250  MG tablet Commonly known as: VITAMIN C Take 1 tablet by mouth daily.   ASPIRIN 81 PO Take 1 tablet by mouth daily.   atorvastatin 80 MG tablet Commonly known as: LIPITOR Take 80 mg by mouth daily.   brimonidine 0.2 % ophthalmic solution Commonly known as: ALPHAGAN Place 1 drop into the left eye 3 (three) times daily.   Brinzolamide-Brimonidine 1-0.2 % Susp Place 0.05 mLs into both eyes 2 (two) times daily.   CALCIUM CARBONATE PO Take 1,200 mg by mouth daily.   cyanocobalamin  250 MCG tablet Commonly known as: VITAMIN B12 Take 500 mcg by mouth daily.   dorzolamide-timolol 2-0.5 % ophthalmic solution Commonly known as: COSOPT Place 1 drop into both eyes 2 (two) times daily.   ferrous sulfate 325 (65 FE) MG tablet Take 325 mg by mouth 2 (two) times daily.   FREESTYLE TEST STRIPS test strip Generic  drug: glucose blood 3 (three) times daily.   glipiZIDE 5 MG tablet Commonly known as: GLUCOTROL Take 1 tablet by mouth 2 (two) times daily.   Januvia 25 MG tablet Generic drug: sitaGLIPtin Take by mouth.   Lantus SoloStar 100 UNIT/ML Solostar Pen Generic drug: insulin glargine Inject 10-15 Units into the skin.   latanoprost 0.005 % ophthalmic solution Commonly known as: XALATAN Place 1 drop into the left eye at bedtime.   lisinopril 10 MG tablet Commonly known as: ZESTRIL Take 1 tablet by mouth every morning.   Rocklatan 0.02-0.005 % Soln Generic drug: Netarsudil-Latanoprost Apply 1 drop to eye daily.   Vitamin D (Ergocalciferol) 1.25 MG (50000 UNIT) Caps capsule Commonly known as: DRISDOL Take 1 capsule by mouth once a week.   VITAMIN E PO Take 1 capsule by mouth daily.        Birth History: non-contributory  Developmental History: non-contributory  Past Surgical History: Past Surgical History:  Procedure Laterality Date   ANKLE FRACTURE SURGERY Left    GLAUCOMA REPAIR Bilateral    with lens and , shunt in right eye, laser surgery left eye x 2   TONSILLECTOMY AND ADENOIDECTOMY     TOTAL VAGINAL HYSTERECTOMY       Family History: Family History  Problem Relation Age of Onset   Breast cancer Mother 53   Hypertension Father    Lung cancer Father    Breast cancer Sister 19   Heart attack Maternal Aunt    Diabetes Maternal Grandmother    Colon cancer Neg Hx    Esophageal cancer Neg Hx    Rectal cancer Neg Hx    Stomach cancer Neg Hx      Social History: Karen Mckenzie lives at home with her family.  She lives in a home. There are dogs inside of the home. There are no dust mite coverings on the bedding. She is a smoker. 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    Review of systems otherwise negative other than that mentioned in the HPI.    Objective:   Blood pressure (!) 140/90, pulse 64, temperature 97.6 F (36.4 C), temperature  source Temporal, resp. rate 16, height 4' 9.13 (1.451 m), weight 92 lb 11.2 oz (42 kg), SpO2 96%. Body mass index is 19.97 kg/m.     Physical Exam Vitals reviewed.  Constitutional:      Appearance: She is well-developed.  HENT:     Head: Normocephalic and atraumatic.     Right Ear: Tympanic membrane, ear canal and external ear normal. No drainage, swelling or  tenderness. Tympanic membrane is not injected, scarred, erythematous, retracted or bulging.     Left Ear: Tympanic membrane, ear canal and external ear normal. No drainage, swelling or tenderness. Tympanic membrane is not injected, scarred, erythematous, retracted or bulging.     Nose: No nasal deformity, septal deviation, mucosal edema or rhinorrhea.     Right Turbinates: Swollen and pale.     Left Turbinates: Swollen and pale.     Right Sinus: No maxillary sinus tenderness or frontal sinus tenderness.     Left Sinus: No maxillary sinus tenderness or frontal sinus tenderness.     Comments: No polyps.    Mouth/Throat:     Mouth: Mucous membranes are not pale and not dry.     Pharynx: Uvula midline.  Eyes:     General:        Right eye: No discharge.        Left eye: No discharge.     Conjunctiva/sclera: Conjunctivae normal.     Right eye: Right conjunctiva is not injected. No chemosis.    Left eye: Left conjunctiva is not injected. No chemosis.    Pupils: Pupils are equal, round, and reactive to light.  Cardiovascular:     Rate and Rhythm: Normal rate and regular rhythm.     Heart sounds: Normal heart sounds.  Pulmonary:     Effort: Pulmonary effort is normal. No tachypnea, accessory muscle usage or respiratory distress.     Breath sounds: Normal breath sounds. No wheezing, rhonchi or rales.  Chest:     Chest wall: No tenderness.  Abdominal:     Tenderness: There is no abdominal tenderness. There is no guarding or rebound.  Lymphadenopathy:     Head:     Right side of head: No submandibular, tonsillar or occipital  adenopathy.     Left side of head: No submandibular, tonsillar or occipital adenopathy.     Cervical: No cervical adenopathy.  Skin:    Coloration: Skin is not pale.     Findings: No abrasion, erythema, petechiae or rash. Rash is not papular, urticarial or vesicular.  Neurological:     Mental Status: She is alert.  Psychiatric:        Behavior: Behavior is cooperative.      Diagnostic studies: deferred due to insurance stipulations that require a separate visit for testing       Marty Shaggy, MD [facility] 

## 2024-03-16 NOTE — Patient Instructions (Addendum)
 1. Allergy to multiple antibiotics - We are only able to do skin testing to the penicillin family. - Let's get that scheduled and then we will do a challenge in the office if this testing is negative. - We can do an azithromycin challenge AND a doxycycline challenge in the future.  - We should probably not address the sulfonamide since this was such a recent reaction.  - We are going to get a tryptase to rule out mast cell disease, which can present with recurrent anaphylaxis reactions (this is not likely, but we want to be thorough).   2. Return in about 2 weeks (around 03/30/2024) for PENICILLIN SKIN TESTING (OK TO SCHEDULE WITH WITH NP). You can have the follow up appointment with Dr. Iva or a Nurse Practicioner (our Nurse Practitioners are excellent and always have Physician oversight!).    Please inform us  of any Emergency Department visits, hospitalizations, or changes in symptoms. Call us  before going to the ED for breathing or allergy symptoms since we might be able to fit you in for a sick visit. Feel free to contact us  anytime with any questions, problems, or concerns.  It was a pleasure to meet you today!  Websites that have reliable patient information: 1. American Academy of Asthma, Allergy, and Immunology: www.aaaai.org 2. Food Allergy Research and Education (FARE): foodallergy.org 3. Mothers of Asthmatics: http://www.asthmacommunitynetwork.org 4. American College of Allergy, Asthma, and Immunology: www.acaai.org      "Like" us  on Facebook and Instagram for our latest updates!      A healthy democracy works best when [employer] participate! Make sure you are registered to vote! If you have moved or changed any of your contact information, you will need to get this updated before voting! Scan the QR codes below to learn more!

## 2024-03-19 LAB — ALLERGEN, MUSHROOM, RF212: Mushroom IgE: <0.1 kU/L

## 2024-03-19 LAB — ALLERGEN, GRAPEFRUIT, F209: Allergen Grapefruit IgE: <0.1 kU/L

## 2024-03-19 LAB — ALLERGEN, TOMATO F25: Allergen Tomato, IgE: <0.1 kU/L

## 2024-03-19 LAB — TRYPTASE: Tryptase: 9 ug/L (ref 2.2–13.2)

## 2024-03-19 LAB — ALLERGEN, ORANGE F33: Orange: <0.1 kU/L

## 2024-03-21 ENCOUNTER — Encounter: Payer: Self-pay | Admitting: Allergy & Immunology

## 2024-03-24 ENCOUNTER — Ambulatory Visit: Payer: Self-pay | Admitting: Allergy & Immunology

## 2024-03-30 ENCOUNTER — Encounter: Payer: Self-pay | Admitting: Allergy & Immunology

## 2024-03-30 ENCOUNTER — Ambulatory Visit (INDEPENDENT_AMBULATORY_CARE_PROVIDER_SITE_OTHER): Payer: Medicare (Managed Care) | Admitting: Allergy & Immunology

## 2024-03-30 DIAGNOSIS — R221 Localized swelling, mass and lump, neck: Secondary | ICD-10-CM | POA: Diagnosis not present

## 2024-03-30 DIAGNOSIS — R220 Localized swelling, mass and lump, head: Secondary | ICD-10-CM | POA: Diagnosis not present

## 2024-03-30 DIAGNOSIS — Z880 Allergy status to penicillin: Secondary | ICD-10-CM | POA: Diagnosis not present

## 2024-03-30 DIAGNOSIS — L299 Pruritus, unspecified: Secondary | ICD-10-CM | POA: Diagnosis not present

## 2024-03-30 NOTE — Patient Instructions (Addendum)
 1. Allergy to multiple antibiotics - Testing was NEGATIVE today, which means that you are likely to tolerate a penicillin challenge. - Schedule that on your way out of the office today. - We will send in penicillin for you to bring to the appointment..   2. Return in about 2 weeks (around 04/13/2024) for PENICILLIN CHALLENGE. You can have the follow up appointment with Dr. Iva or a Nurse Practicioner (our Nurse Practitioners are excellent and always have Physician oversight!).    Please inform us  of any Emergency Department visits, hospitalizations, or changes in symptoms. Call us  before going to the ED for breathing or allergy symptoms since we might be able to fit you in for a sick visit. Feel free to contact us  anytime with any questions, problems, or concerns.  It was a pleasure to see you again today!  Websites that have reliable patient information: 1. American Academy of Asthma, Allergy, and Immunology: www.aaaai.org 2. Food Allergy Research and Education (FARE): foodallergy.org 3. Mothers of Asthmatics: http://www.asthmacommunitynetwork.org 4. American College of Allergy, Asthma, and Immunology: www.acaai.org      "Like" us  on Facebook and Instagram for our latest updates!      A healthy democracy works best when [employer] participate! Make sure you are registered to vote! If you have moved or changed any of your contact information, you will need to get this updated before voting! Scan the QR codes below to learn more!

## 2024-03-30 NOTE — Progress Notes (Signed)
   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 Allergy to multiple antibiotics - Testing was NEGATIVE today, which means that you are likely to tolerate a penicillin challenge. - Schedule that on your way out of the office today. - We will send in penicillin for you to bring to the appointment..   2. Return in about 2 weeks (around 04/13/2024) for PENICILLIN CHALLENGE. You can have the follow up appointment with Dr. Iva or a Nurse Practicioner (our Nurse Practitioners are excellent and always have Physician oversight!).   Subjective:   Karen Mckenzie is a 78 y.o. female presenting today for follow up of No chief complaint on file.   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  Discussed the use of AI scribe software for clinical note transcription with the patient and/or guardian, who gave verbal consent to proceed.  Karen Mckenzie is a 78 y.o. female presenting for skin testing. She was last seen on October 16th. We could not do testing because her insurance company does not cover testing on the same day as a New Patient visit. She has been off of all antihistamines 3 days in anticipation of the testing.   At the time we saw her, she presented with allergies to multiple antibiotics.  We decided to start with penicillin since we can do skin testing.  She presents today for penicillin  testing.  Otherwise, there have been no changes to her past medical history, surgical history, family history, or social history.    Review of systems otherwise negative other than that mentioned in the HPI.    Objective:   There were no vitals taken for this visit. There is no height or weight on file to calculate BMI.    Physical exam deferred since this was a skin testing appointment only.    Diagnostic studies:   Allergy Studies:    Percutaneous Penicillin Testing Control SPT: negative Histamine SPT: 2+ Pre-Pen SPT: negative Pen-G SPT: negative  Intradermal Penicillin Testing Control ID: negative  Pre-Pen ID: negative Pen-G (50 units/mL) ID: negative Pen-G (500 units/mL) ID: negative Pen-G (5000 units/mL) ID: negative  Testing was negative, therefore the patient will proceed with a penicillin challenge at a future visit.    Allergy testing results were read and interpreted by myself, documented by clinical staff.      Marty Iva, MD  [facility]

## 2024-04-13 ENCOUNTER — Encounter: Payer: Self-pay | Admitting: Allergy & Immunology

## 2024-04-13 ENCOUNTER — Ambulatory Visit: Payer: Medicare (Managed Care) | Admitting: Allergy & Immunology

## 2024-04-13 VITALS — BP 124/80 | HR 70 | Temp 97.6°F | Wt 93.4 lb

## 2024-04-13 DIAGNOSIS — Z881 Allergy status to other antibiotic agents status: Secondary | ICD-10-CM | POA: Diagnosis not present

## 2024-04-13 DIAGNOSIS — R220 Localized swelling, mass and lump, head: Secondary | ICD-10-CM

## 2024-04-13 DIAGNOSIS — R221 Localized swelling, mass and lump, neck: Secondary | ICD-10-CM

## 2024-04-13 NOTE — Patient Instructions (Addendum)
 1. Allergy to multiple antibiotics - You tolerated your amoxicillin challenge, which opens up the entire PENICILLIN class of antibiotics. - We are going to do a doxycycline challenge next.  - Make an appointment for that on your way out.    2. Return in about 2 weeks (around 04/27/2024) for DOXYCYCLINE CHALLENGE. You can have the follow up appointment with Dr. Iva or a Nurse Practicioner (our Nurse Practitioners are excellent and always have Physician oversight!).    Please inform us  of any Emergency Department visits, hospitalizations, or changes in symptoms. Call us  before going to the ED for breathing or allergy symptoms since we might be able to fit you in for a sick visit. Feel free to contact us  anytime with any questions, problems, or concerns.  It was a pleasure to see you again today!  Websites that have reliable patient information: 1. American Academy of Asthma, Allergy, and Immunology: www.aaaai.org 2. Food Allergy Research and Education (FARE): foodallergy.org 3. Mothers of Asthmatics: http://www.asthmacommunitynetwork.org 4. American College of Allergy, Asthma, and Immunology: www.acaai.org      "Like" us  on Facebook and Instagram for our latest updates!      A healthy democracy works best when [employer] participate! Make sure you are registered to vote! If you have moved or changed any of your contact information, you will need to get this updated before voting! Scan the QR codes below to learn more!

## 2024-04-13 NOTE — Progress Notes (Signed)
   FOLLOW UP  Date of Service/Encounter:  04/13/24   Assessment:    Swelling of lip, tongue, and throat   Allergy to multiple antibiotics - going to start with penicillin testing first before proceeding with challenges   Seasonal allergies (spring and fall) - treated with antihistamines   Food allergies (orange, mushroom, tomato, grapefruit)     Plan/Recommendations:   1. Allergy to multiple antibiotics - You tolerated your amoxicillin challenge, which opens up the entire PENICILLIN class of antibiotics. - We are going to do a doxycycline challenge next.  - Make an appointment for that on your way out.    2. Return in about 2 weeks (around 04/27/2024) for DOXYCYCLINE CHALLENGE. You can have the follow up appointment with Dr. Iva or a Nurse Practicioner (our Nurse Practitioners are excellent and always have Physician oversight!).    Subjective:   Karen Mckenzie is a 78 y.o. 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  Discussed the use of AI scribe software for clinical note transcription with the patient and/or guardian, who gave verbal consent to proceed.  Karen Mckenzie is a 78 y.o. female presenting for a drug challenge. She was last seen in October 2025 at which point she underwent penicillin testing that was negative. She is here today for a penicillin challenge.  Since the last visit, she has done well. She is feeling confident about her challenge today.   Otherwise, there have been no changes to her past medical history, surgical history, family history, or social  history.    Review of systems otherwise negative other than that mentioned in the HPI.    Objective:   Blood pressure 124/80, pulse 70, temperature 97.6 F (36.4 C), temperature source Temporal, weight 93 lb 6.4 oz (42.4 kg), SpO2 96%. Body mass index is 20.12 kg/m.    Physical exam deferred since this was a challenge appt only.   Diagnostic studies:     She received multiple doses separated by 30 minutes, each of which was separated by vitals and a brief physical exam. She received the following doses:   Amoxicillin (400mg /49mL): 0.1 mL  Amoxicillin (400mg /18mL): 1.25 mL  Amoxicillin (400mg /59mL): 11.25 mL   She was monitored for 60 minutes following the last dose. See flow sheet for vitals.   The patient had negative skin prick tests to penicillin and was able to tolerate the open graded oral challenge today without adverse signs or symptoms. Therefore, she has the same risk of systemic reaction associated with penicillin antibiotics as the general population.      Marty Iva, MD  [facility]

## 2024-05-09 ENCOUNTER — Encounter: Payer: Self-pay | Admitting: Allergy & Immunology

## 2024-05-09 ENCOUNTER — Ambulatory Visit: Payer: Medicare (Managed Care) | Admitting: Allergy & Immunology

## 2024-05-09 VITALS — BP 120/82 | HR 66 | Temp 98.0°F | Resp 16 | Wt 92.6 lb

## 2024-05-09 DIAGNOSIS — Z881 Allergy status to other antibiotic agents status: Secondary | ICD-10-CM | POA: Diagnosis not present

## 2024-05-09 DIAGNOSIS — R221 Localized swelling, mass and lump, neck: Secondary | ICD-10-CM

## 2024-05-09 MED ORDER — AZITHROMYCIN 500 MG PO TABS: ORAL_TABLET | ORAL | 0 refills | Status: AC

## 2024-05-09 NOTE — Progress Notes (Signed)
 FOLLOW UP  Date of Service/Encounter:  05/09/24   Assessment:   Swelling of lip, tongue, and throat   Allergy  to multiple antibiotics - tolerated the doxycycline challenge today  Plan/Recommendations:   1. Allergy  to multiple antibiotics - You tolerated your doxycycline challenge, which opens up the entire doxycycline class of antibiotics. - We are going to do an azithromycin  challenge next.   2. Return in about 2 weeks (around 05/23/2024) for AZITHROMYCIN  CHALLENGE. You can have the follow up appointment with Dr. Iva or a Nurse Practicioner (our Nurse Practitioners are excellent and always have Physician oversight!).   Subjective:   Karen Mckenzie is a 78 y.o. female presenting today for follow up of  Chief Complaint  Patient presents with   Food/Drug Challenge    Doxycycline    Karen Mckenzie has a history of the following: Patient Active Problem List   Diagnosis Date Noted   Gastritis, Helicobacter pylori 02/21/2024   Penicillin allergy  02/21/2024   Hx of allergy  to multiple drugs 02/21/2024   Smoking 1/2 pack a day or less 02/21/2024    History obtained from: chart review and patient.  Discussed the use of AI scribe software for clinical note transcription with the patient and/or guardian, who gave verbal consent to proceed.  Karen Mckenzie is a 78 y.o. female presenting for a drug challenge.  She was last seen in November 2025 for evaluation of an amoxicillin allergy .  She tolerated her challenge without a problem.  We decided to do a doxycycline challenge.  She initially presented to us  in October 2025 for multiple antibiotic allergies.  Since the last visit, she has done well.  She is motivated to do a challenge and get this off of her allergy  list.    Otherwise, there have been no changes to her past medical history, surgical history, family history, or social history.    Review of systems otherwise negative other than that mentioned in the  HPI.    Objective:   Blood pressure 120/82, pulse 66, temperature 98 F (36.7 C), temperature source Temporal, resp. rate 16, weight 92 lb 9.6 oz (42 kg), SpO2 100%. Body mass index is 19.95 kg/m.    Physical Exam Vitals reviewed.  Constitutional:      Appearance: She is well-developed.  HENT:     Head: Normocephalic and atraumatic.     Right Ear: Tympanic membrane, ear canal and external ear normal. No drainage, swelling or tenderness. Tympanic membrane is not injected, scarred, erythematous, retracted or bulging.     Left Ear: Tympanic membrane, ear canal and external ear normal. No drainage, swelling or tenderness. Tympanic membrane is not injected, scarred, erythematous, retracted or bulging.     Nose: No nasal deformity, septal deviation, mucosal edema or rhinorrhea.     Right Turbinates: Enlarged, swollen and pale.     Left Turbinates: Enlarged, swollen and pale.     Right Sinus: No maxillary sinus tenderness or frontal sinus tenderness.     Left Sinus: No maxillary sinus tenderness or frontal sinus tenderness.     Comments: No polyps.    Mouth/Throat:     Mouth: Mucous membranes are not pale and not dry.     Pharynx: Uvula midline.  Eyes:     General:        Right eye: No discharge.        Left eye: No discharge.     Conjunctiva/sclera: Conjunctivae normal.     Right eye: Right conjunctiva is not injected. No  chemosis.    Left eye: Left conjunctiva is not injected. No chemosis.    Pupils: Pupils are equal, round, and reactive to light.  Cardiovascular:     Rate and Rhythm: Normal rate and regular rhythm.     Heart sounds: Normal heart sounds.  Pulmonary:     Effort: Pulmonary effort is normal. No tachypnea, accessory muscle usage or respiratory distress.     Breath sounds: Normal breath sounds. No wheezing, rhonchi or rales.  Chest:     Chest wall: No tenderness.  Abdominal:     Tenderness: There is no abdominal tenderness. There is no guarding or rebound.   Lymphadenopathy:     Head:     Right side of head: No submandibular, tonsillar or occipital adenopathy.     Left side of head: No submandibular, tonsillar or occipital adenopathy.     Cervical: No cervical adenopathy.  Skin:    Coloration: Skin is not pale.     Findings: No abrasion, erythema, petechiae or rash. Rash is not papular, urticarial or vesicular.  Neurological:     Mental Status: She is alert.  Psychiatric:        Behavior: Behavior is cooperative.      Diagnostic studies:   Patient was given 25 mg of a 100 mg tablet.  She was monitored for 30 minutes and tolerated this without a problem.  She then received 75mg  of a 100mg  tablet.  She was monitored for 1 hour and tolerated this without a problem.     Marty Shaggy, MD  [facility]  

## 2024-05-09 NOTE — Patient Instructions (Addendum)
 1. Allergy  to multiple antibiotics - You tolerated your doxycycline challenge, which opens up the entire doxycycline class of antibiotics. - We are going to do an azithromycin  challenge next.   2. Return in about 2 weeks (around 05/23/2024) for AZITHROMYCIN  CHALLENGE. You can have the follow up appointment with Dr. Iva or a Nurse Practicioner (our Nurse Practitioners are excellent and always have Physician oversight!).    Please inform us  of any Emergency Department visits, hospitalizations, or changes in symptoms. Call us  before going to the ED for breathing or allergy  symptoms since we might be able to fit you in for a sick visit. Feel free to contact us  anytime with any questions, problems, or concerns.  It was a pleasure to see you again today!  Websites that have reliable patient information: 1. American Academy of Asthma, Allergy , and Immunology: www.aaaai.org 2. Food Allergy  Research and Education (FARE): foodallergy.org 3. Mothers of Asthmatics: http://www.asthmacommunitynetwork.org 4. [employer] of Allergy , Asthma, and Immunology: www.acaai.org      "Like" us  on Facebook and Instagram for our latest updates!      A healthy democracy works best when [employer] participate! Make sure you are registered to vote! If you have moved or changed any of your contact information, you will need to get this updated before voting! Scan the QR codes below to learn more!

## 2024-05-16 ENCOUNTER — Ambulatory Visit: Payer: Medicare (Managed Care) | Admitting: Allergy & Immunology

## 2024-06-13 ENCOUNTER — Encounter: Payer: Medicare (Managed Care) | Admitting: Allergy & Immunology

## 2024-07-06 ENCOUNTER — Encounter: Payer: Medicare (Managed Care) | Admitting: Allergy & Immunology

## 2024-07-11 ENCOUNTER — Encounter: Payer: Medicare (Managed Care) | Admitting: Allergy & Immunology
